# Patient Record
Sex: Female | Born: 1975 | Race: White | Hispanic: No | Smoking: Current every day smoker
Health system: Southern US, Community
[De-identification: ages and names within clinical notes are randomized; demographics above are authoritative.]

## PROBLEM LIST (undated history)

## (undated) DIAGNOSIS — F419 Anxiety disorder, unspecified: Secondary | ICD-10-CM

## (undated) DIAGNOSIS — Z21 Asymptomatic human immunodeficiency virus [HIV] infection status: Secondary | ICD-10-CM

## (undated) DIAGNOSIS — M549 Dorsalgia, unspecified: Secondary | ICD-10-CM

## (undated) DIAGNOSIS — I10 Essential (primary) hypertension: Secondary | ICD-10-CM

## (undated) DIAGNOSIS — K759 Inflammatory liver disease, unspecified: Secondary | ICD-10-CM

## (undated) DIAGNOSIS — T1491XA Suicide attempt, initial encounter: Secondary | ICD-10-CM

## (undated) DIAGNOSIS — R569 Unspecified convulsions: Secondary | ICD-10-CM

## (undated) DIAGNOSIS — K859 Acute pancreatitis without necrosis or infection, unspecified: Secondary | ICD-10-CM

## (undated) DIAGNOSIS — Z8739 Personal history of other diseases of the musculoskeletal system and connective tissue: Secondary | ICD-10-CM

## (undated) DIAGNOSIS — IMO0002 Reserved for concepts with insufficient information to code with codable children: Secondary | ICD-10-CM

## (undated) DIAGNOSIS — B20 Human immunodeficiency virus [HIV] disease: Secondary | ICD-10-CM

## (undated) HISTORY — PX: BACK SURGERY: SHX140

---

## 2002-04-09 ENCOUNTER — Emergency Department (HOSPITAL_COMMUNITY): Admission: EM | Admit: 2002-04-09 | Discharge: 2002-04-09 | Payer: Self-pay

## 2002-04-12 ENCOUNTER — Encounter: Payer: Self-pay | Admitting: *Deleted

## 2002-04-12 ENCOUNTER — Inpatient Hospital Stay (HOSPITAL_COMMUNITY): Admission: AD | Admit: 2002-04-12 | Discharge: 2002-04-12 | Payer: Self-pay | Admitting: *Deleted

## 2002-05-04 ENCOUNTER — Inpatient Hospital Stay (HOSPITAL_COMMUNITY): Admission: AD | Admit: 2002-05-04 | Discharge: 2002-05-04 | Payer: Self-pay | Admitting: Obstetrics and Gynecology

## 2002-05-04 ENCOUNTER — Inpatient Hospital Stay (HOSPITAL_COMMUNITY): Admission: AD | Admit: 2002-05-04 | Discharge: 2002-05-05 | Payer: Self-pay | Admitting: Obstetrics and Gynecology

## 2002-05-04 ENCOUNTER — Encounter: Payer: Self-pay | Admitting: Obstetrics and Gynecology

## 2011-09-22 ENCOUNTER — Encounter (HOSPITAL_COMMUNITY): Payer: Self-pay | Admitting: Emergency Medicine

## 2011-09-22 ENCOUNTER — Emergency Department (HOSPITAL_COMMUNITY)
Admission: EM | Admit: 2011-09-22 | Discharge: 2011-09-22 | Disposition: A | Discharge status: Home / Self Care | Payer: Self-pay | Attending: Emergency Medicine | Admitting: Emergency Medicine

## 2011-09-22 DIAGNOSIS — Z21 Asymptomatic human immunodeficiency virus [HIV] infection status: Secondary | ICD-10-CM | POA: Insufficient documentation

## 2011-09-22 DIAGNOSIS — G8929 Other chronic pain: Secondary | ICD-10-CM | POA: Insufficient documentation

## 2011-09-22 DIAGNOSIS — R569 Unspecified convulsions: Secondary | ICD-10-CM | POA: Insufficient documentation

## 2011-09-22 DIAGNOSIS — R1110 Vomiting, unspecified: Secondary | ICD-10-CM | POA: Insufficient documentation

## 2011-09-22 HISTORY — DX: Inflammatory liver disease, unspecified: K75.9

## 2011-09-22 HISTORY — DX: Unspecified convulsions: R56.9

## 2011-09-22 HISTORY — DX: Dorsalgia, unspecified: M54.9

## 2011-09-22 HISTORY — DX: Asymptomatic human immunodeficiency virus (hiv) infection status: Z21

## 2011-09-22 HISTORY — DX: Human immunodeficiency virus (HIV) disease: B20

## 2011-09-22 MED ORDER — MORPHINE SULFATE 4 MG/ML IJ SOLN
8.0000 mg | Freq: Once | INTRAMUSCULAR | Status: AC
  Administered 2011-09-22: 8 mg via INTRAVENOUS
  Filled 2011-09-22 (×2): qty 1

## 2011-09-22 MED ORDER — ONDANSETRON HCL 4 MG/2ML IJ SOLN
4.0000 mg | Freq: Once | INTRAMUSCULAR | Status: AC
  Administered 2011-09-22: 4 mg via INTRAVENOUS
  Filled 2011-09-22: qty 2

## 2011-09-22 MED ORDER — LORAZEPAM 1 MG PO TABS: 1.0000 mg | ORAL_TABLET | Freq: Once | ORAL | Status: DC

## 2011-09-22 MED ORDER — OXYCODONE-ACETAMINOPHEN 5-325 MG PO TABS: 1.0000 | ORAL_TABLET | Freq: Once | ORAL | Status: DC

## 2011-09-22 MED ORDER — LORAZEPAM 2 MG/ML IJ SOLN
2.0000 mg | Freq: Once | INTRAMUSCULAR | Status: AC
  Administered 2011-09-22: 2 mg via INTRAVENOUS
  Filled 2011-09-22: qty 1

## 2011-09-22 NOTE — ED Notes (Signed)
Not able to establish IV access.  IV team paged.
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
## 2011-09-22 NOTE — ED Notes (Signed)
Pt has a hx of seizures tonic clonic seizure friends seen her having a seizure approx 3 mins, was on sofa, alert to person place but does not remember having a seizure, not taking her xanax which is what she takes for her seizures. Standing and walked to stretcher, hx of this for the past 6 months smells of etoh .

## 2011-09-22 NOTE — Discharge Instructions (Signed)
Seizures You had a seizure. About 2% of the population will have a seizure problem during their lifetime. Sometimes the cause for the seizure is not known. Seizures are usually associated with one of these problems:  Epilepsy.   Not taking your seizure medicine.   Alcohol and drug abuse.   Head injury, strokes, tumors, and brain surgery.   High fever and infections.   Low blood sugar.  Evaluating a new seizure disorder may require having a brain scan or a brain wave test called an EEG. If you have been given a seizure medicine, it is very important that you take it as prescribed. Not taking these medicines as directed is the most common cause of seizures. Blood tests are often used to be sure you are taking the proper dose.  Seizures cause many different symptoms, from convulsions to brief blackouts. Do not ride a bike, drive a car, go swimming, climb in high or dangerous places such as ladders or roofs, or operate any dangerous equipment until you have your doctor's permission. If you hold a driver's license, state law may require that a report be made to the motor vehicles department. You should wear an emergency medical identification bracelet with information about your seizures. If you have any warning that a seizure may occur, lie down in a safe place to protect yourself. Teach your family and friends what to do if you have any further seizures. They should stay calm and try to keep you from falling on hard or sharp objects. It is best not to try to restrain a seizing person or to force anything into his or her mouth. Do not try to open clenched jaws. When the seizure is over, the person should be rolled on their side to help drain any vomit or secretions from the mouth. After a seizure, a person may be confused or drowsy for several minutes. An ambulance should be called if the seizure lasted more than 5 minutes or if confusion remains for more than 30 minutes. Call your caregiver or the  emergency department for further instructions. Do not drive until cleared by your caregiver or neurologist! Document Released: 06/12/2004 Document Revised: 01/15/2011 Document Reviewed: 05/05/2005 [employer] Patient Information 2012 [employer]. 

## 2012-03-06 ENCOUNTER — Emergency Department (HOSPITAL_BASED_OUTPATIENT_CLINIC_OR_DEPARTMENT_OTHER): Payer: Self-pay

## 2012-03-06 ENCOUNTER — Emergency Department (HOSPITAL_BASED_OUTPATIENT_CLINIC_OR_DEPARTMENT_OTHER)
Admission: EM | Admit: 2012-03-06 | Discharge: 2012-03-07 | Disposition: A | Discharge status: Home / Self Care | Payer: Self-pay | Attending: Emergency Medicine | Admitting: Emergency Medicine

## 2012-03-06 ENCOUNTER — Encounter (HOSPITAL_BASED_OUTPATIENT_CLINIC_OR_DEPARTMENT_OTHER): Payer: Self-pay | Admitting: *Deleted

## 2012-03-06 DIAGNOSIS — Z21 Asymptomatic human immunodeficiency virus [HIV] infection status: Secondary | ICD-10-CM | POA: Insufficient documentation

## 2012-03-06 DIAGNOSIS — S0181XA Laceration without foreign body of other part of head, initial encounter: Secondary | ICD-10-CM

## 2012-03-06 DIAGNOSIS — S0003XA Contusion of scalp, initial encounter: Secondary | ICD-10-CM | POA: Insufficient documentation

## 2012-03-06 DIAGNOSIS — R4789 Other speech disturbances: Secondary | ICD-10-CM | POA: Insufficient documentation

## 2012-03-06 DIAGNOSIS — S0180XA Unspecified open wound of other part of head, initial encounter: Secondary | ICD-10-CM | POA: Insufficient documentation

## 2012-03-06 DIAGNOSIS — S1093XA Contusion of unspecified part of neck, initial encounter: Secondary | ICD-10-CM | POA: Insufficient documentation

## 2012-03-06 HISTORY — DX: Acute pancreatitis without necrosis or infection, unspecified: K85.90

## 2012-03-06 HISTORY — DX: Reserved for concepts with insufficient information to code with codable children: IMO0002

## 2012-03-06 MED ORDER — LIDOCAINE-EPINEPHRINE 2 %-1:100000 IJ SOLN
20.0000 mL | Freq: Once | INTRAMUSCULAR | Status: DC
  Filled 2012-03-06: qty 1
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
## 2012-03-06 NOTE — ED Notes (Signed)
Sprite given to pt. Per MD request.

## 2012-03-06 NOTE — ED Notes (Signed)
MD at bedside to suture.
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
## 2012-03-07 MED ORDER — ALPRAZOLAM 0.5 MG PO TABS
2.0000 mg | ORAL_TABLET | Freq: Once | ORAL | Status: AC
  Administered 2012-03-07: 2 mg via ORAL
  Filled 2012-03-07: qty 4
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
## 2012-09-21 ENCOUNTER — Encounter (HOSPITAL_BASED_OUTPATIENT_CLINIC_OR_DEPARTMENT_OTHER): Payer: Self-pay | Admitting: *Deleted

## 2012-09-21 ENCOUNTER — Emergency Department (HOSPITAL_BASED_OUTPATIENT_CLINIC_OR_DEPARTMENT_OTHER)
Admission: EM | Admit: 2012-09-21 | Discharge: 2012-09-21 | Disposition: A | Discharge status: Home / Self Care | Payer: Self-pay | Attending: Emergency Medicine | Admitting: Emergency Medicine

## 2012-09-21 DIAGNOSIS — L02414 Cutaneous abscess of left upper limb: Secondary | ICD-10-CM

## 2012-09-21 DIAGNOSIS — R52 Pain, unspecified: Secondary | ICD-10-CM | POA: Insufficient documentation

## 2012-09-21 DIAGNOSIS — Z8669 Personal history of other diseases of the nervous system and sense organs: Secondary | ICD-10-CM | POA: Insufficient documentation

## 2012-09-21 DIAGNOSIS — Z21 Asymptomatic human immunodeficiency virus [HIV] infection status: Secondary | ICD-10-CM | POA: Insufficient documentation

## 2012-09-21 DIAGNOSIS — R509 Fever, unspecified: Secondary | ICD-10-CM | POA: Insufficient documentation

## 2012-09-21 DIAGNOSIS — F17200 Nicotine dependence, unspecified, uncomplicated: Secondary | ICD-10-CM | POA: Insufficient documentation

## 2012-09-21 DIAGNOSIS — Z8739 Personal history of other diseases of the musculoskeletal system and connective tissue: Secondary | ICD-10-CM | POA: Insufficient documentation

## 2012-09-21 DIAGNOSIS — L0390 Cellulitis, unspecified: Secondary | ICD-10-CM

## 2012-09-21 DIAGNOSIS — IMO0002 Reserved for concepts with insufficient information to code with codable children: Secondary | ICD-10-CM | POA: Insufficient documentation

## 2012-09-21 DIAGNOSIS — R110 Nausea: Secondary | ICD-10-CM | POA: Insufficient documentation

## 2012-09-21 DIAGNOSIS — Z8719 Personal history of other diseases of the digestive system: Secondary | ICD-10-CM | POA: Insufficient documentation

## 2012-09-21 DIAGNOSIS — Z79899 Other long term (current) drug therapy: Secondary | ICD-10-CM | POA: Insufficient documentation

## 2012-09-21 MED ORDER — FENTANYL CITRATE 0.05 MG/ML IJ SOLN
100.0000 ug | Freq: Once | INTRAMUSCULAR | Status: AC
  Administered 2012-09-21: 100 ug via NASAL
  Filled 2012-09-21: qty 2

## 2012-09-21 MED ORDER — CLINDAMYCIN HCL 300 MG PO CAPS: 300.0000 mg | ORAL_CAPSULE | Freq: Four times a day (QID) | ORAL | Status: DC

## 2012-09-21 MED ORDER — FENTANYL CITRATE 0.05 MG/ML IJ SOLN
INTRAMUSCULAR | Status: AC
  Administered 2012-09-21: 50 ug
  Filled 2012-09-21: qty 2

## 2012-09-21 MED ORDER — LIDOCAINE-EPINEPHRINE 2 %-1:100000 IJ SOLN
INTRAMUSCULAR | Status: AC
  Filled 2012-09-21: qty 1

## 2012-09-21 NOTE — ED Notes (Signed)
Has been injecting Oxycodone in her left deltoid now it is infected.

## 2012-09-21 NOTE — ED Provider Notes (Signed)
History     CSN: [id number]  Arrival date & time 09/21/12  1339   First MD Initiated Contact with Patient 09/21/12 1526      Chief Complaint  Patient presents with  . Abscess    (Consider location/radiation/quality/duration/timing/severity/associated sxs/prior treatment) HPI This 37 year old female has several days gradual onset left arm lateral deltoid region abscess gradually worsening with surrounding erythema induration cellulitis with fever up to 103 last night but no fever today she did have some body aches and nausea last night but no generalized body aches today, she is no lightheadedness no confusion no shortness of breath no generalized rash she is no change in her chronic severe low back pain and chronic severe abdominal pain there is no treatment prior to arrival she does have history of IV drug abuse but does not inject in the area where she has her abscess. Past Medical History  Diagnosis Date  . Seizures   . Back ache   . HIV (human immunodeficiency virus infection)   . Hepatitis   . DDD (degenerative disc disease)   . Pancreatitis     Past Surgical History  Procedure Laterality Date  . Back surgery      No family history on file.  History  Substance Use Topics  . Smoking status: Current Every Day Smoker  . Smokeless tobacco: Not on file  . Alcohol Use: Yes     Comment: occasional    OB History   Grav Para Term Preterm Abortions TAB SAB Ect Mult Living                  Review of Systems 10 Systems reviewed and are negative for acute change except as noted in the HPI. Allergies  Sulfa antibiotics  Home Medications   Current Outpatient Rx  Name  Route  Sig  Dispense  Refill  . alprazolam (XANAX) 2 MG tablet   Oral   Take 2 mg by mouth 3 (three) times daily as needed. For anxiety         . Buprenorphine HCl-Naloxone HCl (SUBOXONE) 4-1 MG FILM   Sublingual   Place 2 tablets under the tongue every 4 (four) hours.         . clindamycin  (CLEOCIN) 300 MG capsule   Oral   Take 1 capsule (300 mg total) by mouth 4 (four) times daily. X 7 days   28 capsule   0   . HYDROcodone-acetaminophen (NORCO/VICODIN) 5-325 MG per tablet   Oral   Take 2 tablets by mouth every 4 (four) hours as needed for pain.   6 tablet   0   . oxyCODONE (OXYCONTIN) 20 MG 12 hr tablet   Oral   Take 20 mg by mouth every 12 (twelve) hours.         Marland Kitchen oxycodone (ROXICODONE) 30 MG immediate release tablet   Oral   Take 30 mg by mouth every 4 (four) hours as needed for pain.           BP 138/97  Pulse 106  Temp(Src) 98.9 F (37.2 C) (Oral)  Resp 20  Wt 110 lb (49.896 kg)  SpO2 97%  Physical Exam  Nursing note and vitals reviewed. Constitutional:  Awake, alert, nontoxic appearance.  HENT:  Head: Atraumatic.  Eyes: Right eye exhibits no discharge. Left eye exhibits no discharge.  Neck: Neck supple.  Cardiovascular: Regular rhythm.   No murmur heard. Mildly tachycardic  Pulmonary/Chest: Effort normal and breath sounds normal. No respiratory  distress. She has no wheezes. She has no rales. She exhibits no tenderness.  Abdominal: Soft. Bowel sounds are normal. She exhibits no distension and no mass. There is tenderness. There is no rebound and no guarding.  Minimal diffuse abdominal tenderness which patient states is baseline for her  Musculoskeletal: She exhibits tenderness.  Baseline ROM, no obvious new focal weakness. Mild diffuse lumbar back tenderness which patient states is baseline for her right arm is nontender both legs are nontender left arm has radial pulse intact capillary refill less than 2 seconds normal light touch good range of motion of her arm including her hand with no obvious focal weakness noted her left deltoid region as erythema induration warmth tenderness consistent with localized cellulitis with a central abscess approximately 4 cm in diameter fluctuance confirmed in the subcutaneous space as a fluid collection with  bedside limited ultrasound  Neurological:  Mental status and motor strength appears baseline for patient and situation.  Skin: No rash noted.  Psychiatric: She has a normal mood and affect.    ED Course  Procedures (including critical care time) I&D Abscess: Timeout taken, sterile prep , 2% lidocaine with epinephrine local anesthetic 8 MLS, #11 scalpel 3 cm incision with copious purulent drainage and loculations broken and is complicated subcutaneous abscess, Pt tolerated procedure well with no apparent immediate complications.  Patient appears nontoxic in the emergency department I doubt sepsis or necrotizing fasciitis, treatment options are discussed and I believe a reasonable decision made to attempt incision and drainage in the emergency department which was performed then place the patient on oral antibiotics for close outpatient followup tomorrow in the emergency department since the patient has no primary care physician. Labs Reviewed - No data to display No results found.   1. Abscess of arm, left   2. Cellulitis       MDM  Patient / Family / Caregiver informed of clinical course, understand medical decision-making process, and agree with plan.  I doubt any other EMC precluding discharge at this time including, but not necessarily limited to the following:sepsis.        Hurman Horn, MD 09/22/12 [phone]

## 2012-09-22 ENCOUNTER — Emergency Department (HOSPITAL_BASED_OUTPATIENT_CLINIC_OR_DEPARTMENT_OTHER)
Admission: EM | Admit: 2012-09-22 | Discharge: 2012-09-22 | Disposition: A | Discharge status: Home / Self Care | Payer: Self-pay | Attending: Emergency Medicine | Admitting: Emergency Medicine

## 2012-09-22 ENCOUNTER — Encounter (HOSPITAL_BASED_OUTPATIENT_CLINIC_OR_DEPARTMENT_OTHER): Payer: Self-pay

## 2012-09-22 DIAGNOSIS — IMO0002 Reserved for concepts with insufficient information to code with codable children: Secondary | ICD-10-CM | POA: Insufficient documentation

## 2012-09-22 DIAGNOSIS — Z8669 Personal history of other diseases of the nervous system and sense organs: Secondary | ICD-10-CM | POA: Insufficient documentation

## 2012-09-22 DIAGNOSIS — F17200 Nicotine dependence, unspecified, uncomplicated: Secondary | ICD-10-CM | POA: Insufficient documentation

## 2012-09-22 DIAGNOSIS — Z8719 Personal history of other diseases of the digestive system: Secondary | ICD-10-CM | POA: Insufficient documentation

## 2012-09-22 DIAGNOSIS — Z21 Asymptomatic human immunodeficiency virus [HIV] infection status: Secondary | ICD-10-CM | POA: Insufficient documentation

## 2012-09-22 DIAGNOSIS — L0390 Cellulitis, unspecified: Secondary | ICD-10-CM

## 2012-09-22 DIAGNOSIS — Z79899 Other long term (current) drug therapy: Secondary | ICD-10-CM | POA: Insufficient documentation

## 2012-09-22 DIAGNOSIS — Z8739 Personal history of other diseases of the musculoskeletal system and connective tissue: Secondary | ICD-10-CM | POA: Insufficient documentation

## 2012-09-22 MED ORDER — HYDROCODONE-ACETAMINOPHEN 5-325 MG PO TABS: 2.0000 | ORAL_TABLET | ORAL | Status: DC | PRN

## 2012-09-22 NOTE — ED Provider Notes (Signed)
History     CSN: [id number]  Arrival date & time 09/22/12  1135   First MD Initiated Contact with Patient 09/22/12 1146      Chief Complaint  Patient presents with  . Wound Check    (Consider location/radiation/quality/duration/timing/severity/associated sxs/prior treatment) HPI Comments: Patient presents to the ER for recheck of a left upper arm abscess with overlying cellulitis. Patient had incision and drainage yesterday. Patient does report that her redness and swelling is improved. She is concerned that there might still be some loss in the area of the shoulder region, however, because the area is still hard to the touch. No fevers. She is taking the oral antibiotics.  Patient is a 37 y.o. female presenting with wound check.  Wound Check    Past Medical History  Diagnosis Date  . Seizures   . Back ache   . HIV (human immunodeficiency virus infection)   . Hepatitis   . DDD (degenerative disc disease)   . Pancreatitis     Past Surgical History  Procedure Laterality Date  . Back surgery      No family history on file.  History  Substance Use Topics  . Smoking status: Current Every Day Smoker  . Smokeless tobacco: Not on file  . Alcohol Use: Yes     Comment: occasional    OB History   Grav Para Term Preterm Abortions TAB SAB Ect Mult Living                  Review of Systems  Constitutional: Negative for fever.  Skin: Positive for wound.    Allergies  Sulfa antibiotics  Home Medications   Current Outpatient Rx  Name  Route  Sig  Dispense  Refill  . oxycodone (ROXICODONE) 30 MG immediate release tablet   Oral   Take 30 mg by mouth every 4 (four) hours as needed for pain.         Marland Kitchen alprazolam (XANAX) 2 MG tablet   Oral   Take 2 mg by mouth 3 (three) times daily as needed. For anxiety         . Buprenorphine HCl-Naloxone HCl (SUBOXONE) 4-1 MG FILM   Sublingual   Place 2 tablets under the tongue every 4 (four) hours.         . clindamycin  (CLEOCIN) 300 MG capsule   Oral   Take 1 capsule (300 mg total) by mouth 4 (four) times daily. X 7 days   28 capsule   0   . oxyCODONE (OXYCONTIN) 20 MG 12 hr tablet   Oral   Take 20 mg by mouth every 12 (twelve) hours.           BP 105/66  Pulse 106  Temp(Src) 98.7 F (37.1 C) (Oral)  Resp 16  SpO2 100%  Physical Exam  Constitutional: She is oriented to person, place, and time. She appears well-developed and well-nourished.  HENT:  Head: Normocephalic.  Eyes: Pupils are equal, round, and reactive to light.  Neck: Normal range of motion.  Cardiovascular: Normal rate, regular rhythm and normal heart sounds.   Pulmonary/Chest: Effort normal and breath sounds normal.  Musculoskeletal: Normal range of motion.  Neurological: She is alert and oriented to person, place, and time. A cranial nerve deficit is present. Coordination normal.  Skin:       ED Course  Procedures (including critical care time)  Labs Reviewed - No data to display No results found.   Diagnosis: 1. Abscess 2.  Cellulitis    MDM  This presents to the ER for check after incision and drainage. Patient does feel that the erythema of the upper arm is much improved. She appears to be responding to the antibiotics. She did have drainage of pus yesterday. She was concerned that maybe some more pus in a different area. The area was therefore evaluated with bedside ultrasound and no fluid pockets are seen.  Has had significant improvement since yesterday with current treatment regimen. Continue current plan and will return if symptoms worsen.       Gilda Crease, MD 09/22/12 1218

## 2012-09-22 NOTE — ED Notes (Signed)
Wound check to left upper arm-had area to left upper arm where PICC line was removed approx 1 month ago

## 2013-02-21 ENCOUNTER — Ambulatory Visit (INDEPENDENT_AMBULATORY_CARE_PROVIDER_SITE_OTHER): Payer: Self-pay | Admitting: Physician Assistant

## 2013-02-21 ENCOUNTER — Encounter: Payer: Self-pay | Admitting: Physician Assistant

## 2013-02-21 ENCOUNTER — Ambulatory Visit: Payer: Self-pay | Admitting: Physician Assistant

## 2013-02-21 VITALS — BP 100/70 | HR 126 | Ht 66.0 in | Wt 124.0 lb

## 2013-02-21 DIAGNOSIS — M5136 Other intervertebral disc degeneration, lumbar region: Secondary | ICD-10-CM

## 2013-02-21 DIAGNOSIS — M5137 Other intervertebral disc degeneration, lumbosacral region: Secondary | ICD-10-CM

## 2013-02-21 NOTE — Patient Instructions (Signed)
[facility].

## 2013-02-21 NOTE — Progress Notes (Addendum)
  Subjective:    Patient ID: Alicia Hayes, female    DOB: [birth year], 37 y.o.   MRN: [id number]  HPI Patient is a 37 yo female who presents to the clinic to establish care. She is wanting pain medication and xanax refills today. Per patient patient is taking Xanax 2 mg 5 times a day as well as oxycodone 30 mg up to 6 times a day. This has been taking for severe anxiety and panic attacks as well as chronic pain and degenerative disc disease. Per patient she has had lumbar back surgery. Patient does not present with any records. She reports that her PCP has retired and she needs someone to pick up prescribing her pain medications and anxiety medications. She does bring in some bottles but they are dated in November of 2013 she does have a history of a withdrawal seizure from xanax.   She is not aware of when her last Pap smear was. She does currently smoke one pack a day. Patient is also HIV positive.   Review of Systems     Objective:   Physical Exam  Constitutional:  She appeared "out of it". She struggled to make eye contact and shake my hand. I found her trying to open up the drawer in exam room but did not have enough coordination to do so.   Psychiatric:  She slurred her words. She was very adamant about pain medication and xanax.          Assessment & Plan:  DDD/chronic pain syndrome- GAD-7 was 21. PHQ-9 was 19. Patient is adamant about having her Xanax and oxycodone refilled today. I discussed with Dr. Glade Lloyd and there were too many red flags to refill today. We discussed with patient that she was on too high the dose and we needed to taper her down. She reports that Klonopin does not work.Discuss with patient that I would like to see her records from Dr. Troy Sine before I started prescribing any medications. I also discussed with her that she needs to be going to a pain clinic. I did put in a referral for pain clinic today. When patient comes I was not going to give her her pain  medication she began talking about how she has to have it or she will go into nausea, vomiting, diarrhea. I discussed with her that those are withdrawal symptoms and that she needs to be detoxed. Patient was not receptive to being told this. I offered resources such as [facility].or [facility]. She did not want to discuss this option. She stated she needed her medications. When I looked into the national database registry for controlled substances oxycodone was listed in two-week increments from a Dr. Troy Sine. Xanax was not found in the system at all. Patient left without finishing the rest of the visit once denied pain medication. We did not have the opportunity to do physical exam or talk about other medications for anxiety.

## 2013-04-15 ENCOUNTER — Emergency Department (HOSPITAL_COMMUNITY)
Admission: EM | Admit: 2013-04-15 | Discharge: 2013-04-16 | Disposition: A | Discharge status: Home / Self Care | Payer: Self-pay | Attending: Emergency Medicine | Admitting: Emergency Medicine

## 2013-04-15 ENCOUNTER — Encounter (HOSPITAL_COMMUNITY): Payer: Self-pay | Admitting: Emergency Medicine

## 2013-04-15 DIAGNOSIS — Z8719 Personal history of other diseases of the digestive system: Secondary | ICD-10-CM | POA: Insufficient documentation

## 2013-04-15 DIAGNOSIS — Z3202 Encounter for pregnancy test, result negative: Secondary | ICD-10-CM | POA: Insufficient documentation

## 2013-04-15 DIAGNOSIS — F1310 Sedative, hypnotic or anxiolytic abuse, uncomplicated: Secondary | ICD-10-CM | POA: Insufficient documentation

## 2013-04-15 DIAGNOSIS — R000 Tachycardia, unspecified: Secondary | ICD-10-CM | POA: Insufficient documentation

## 2013-04-15 DIAGNOSIS — Z21 Asymptomatic human immunodeficiency virus [HIV] infection status: Secondary | ICD-10-CM | POA: Insufficient documentation

## 2013-04-15 DIAGNOSIS — Z8739 Personal history of other diseases of the musculoskeletal system and connective tissue: Secondary | ICD-10-CM | POA: Insufficient documentation

## 2013-04-15 DIAGNOSIS — Y939 Activity, unspecified: Secondary | ICD-10-CM | POA: Insufficient documentation

## 2013-04-15 DIAGNOSIS — F1110 Opioid abuse, uncomplicated: Secondary | ICD-10-CM | POA: Insufficient documentation

## 2013-04-15 DIAGNOSIS — F17200 Nicotine dependence, unspecified, uncomplicated: Secondary | ICD-10-CM | POA: Insufficient documentation

## 2013-04-15 DIAGNOSIS — Y929 Unspecified place or not applicable: Secondary | ICD-10-CM | POA: Insufficient documentation

## 2013-04-15 DIAGNOSIS — X58XXXA Exposure to other specified factors, initial encounter: Secondary | ICD-10-CM | POA: Insufficient documentation

## 2013-04-15 DIAGNOSIS — Z8669 Personal history of other diseases of the nervous system and sense organs: Secondary | ICD-10-CM | POA: Insufficient documentation

## 2013-04-15 DIAGNOSIS — F1910 Other psychoactive substance abuse, uncomplicated: Secondary | ICD-10-CM

## 2013-04-15 DIAGNOSIS — S0003XA Contusion of scalp, initial encounter: Secondary | ICD-10-CM | POA: Insufficient documentation

## 2013-04-15 DIAGNOSIS — F10929 Alcohol use, unspecified with intoxication, unspecified: Secondary | ICD-10-CM

## 2013-04-15 DIAGNOSIS — F1010 Alcohol abuse, uncomplicated: Secondary | ICD-10-CM | POA: Insufficient documentation

## 2013-04-15 LAB — URINALYSIS, ROUTINE W REFLEX MICROSCOPIC
Hgb urine dipstick: NEGATIVE
Leukocytes, UA: NEGATIVE
Nitrite: NEGATIVE
Protein, ur: NEGATIVE mg/dL
Specific Gravity, Urine: 1.015 (ref 1.005–1.030)
Urobilinogen, UA: 0.2 mg/dL (ref 0.0–1.0)

## 2013-04-15 LAB — CBC WITH DIFFERENTIAL/PLATELET
Basophils Absolute: 0.1 10*3/uL (ref 0.0–0.1)
Basophils Relative: 2 % — ABNORMAL HIGH (ref 0–1)
Eosinophils Absolute: 0.3 10*3/uL (ref 0.0–0.7)
MCH: 33.9 pg (ref 26.0–34.0)
MCHC: 34.8 g/dL (ref 30.0–36.0)
Monocytes Absolute: 0.3 10*3/uL (ref 0.1–1.0)
Neutro Abs: 2.9 10*3/uL (ref 1.7–7.7)
Neutrophils Relative %: 48 % (ref 43–77)
RDW: 15.1 % (ref 11.5–15.5)

## 2013-04-15 LAB — PREGNANCY, URINE: Preg Test, Ur: NEGATIVE

## 2013-04-15 LAB — ACETAMINOPHEN LEVEL: Acetaminophen (Tylenol), Serum: <15 ug/mL (ref 10–30)

## 2013-04-15 LAB — COMPREHENSIVE METABOLIC PANEL
AST: 46 U/L — ABNORMAL HIGH (ref 0–37)
Albumin: 4.6 g/dL (ref 3.5–5.2)
Chloride: 103 mEq/L (ref 96–112)
Creatinine, Ser: 0.5 mg/dL (ref 0.50–1.10)
Total Bilirubin: 0.2 mg/dL — ABNORMAL LOW (ref 0.3–1.2)
Total Protein: 8.7 g/dL — ABNORMAL HIGH (ref 6.0–8.3)

## 2013-04-15 LAB — POCT I-STAT, CHEM 8
BUN: 6 mg/dL (ref 6–23)
Chloride: 106 mEq/L (ref 96–112)
Creatinine, Ser: 1.3 mg/dL — ABNORMAL HIGH (ref 0.50–1.10)
Glucose, Bld: 96 mg/dL (ref 70–99)
Hemoglobin: 15.3 g/dL — ABNORMAL HIGH (ref 12.0–15.0)
Potassium: 3.2 mEq/L — ABNORMAL LOW (ref 3.5–5.1)
Sodium: 146 mEq/L — ABNORMAL HIGH (ref 135–145)

## 2013-04-15 LAB — SALICYLATE LEVEL: Salicylate Lvl: <2 mg/dL — ABNORMAL LOW (ref 2.8–20.0)

## 2013-04-15 LAB — ETHANOL: Alcohol, Ethyl (B): 541 mg/dL (ref 0–11)

## 2013-04-15 LAB — RAPID URINE DRUG SCREEN, HOSP PERFORMED: Opiates: NOT DETECTED

## 2013-04-15 MED ORDER — NALOXONE HCL 0.4 MG/ML IJ SOLN
0.2000 mg | Freq: Once | INTRAMUSCULAR | Status: AC
  Administered 2013-04-15: 0.2 mg via INTRAVENOUS
  Filled 2013-04-15: qty 1

## 2013-04-15 MED ORDER — SODIUM CHLORIDE 0.9 % IV BOLUS (SEPSIS)
1000.0000 mL | Freq: Once | INTRAVENOUS | Status: AC
  Administered 2013-04-15: 1000 mL via INTRAVENOUS
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
## 2013-04-15 NOTE — ED Notes (Signed)
Per EMS: Pt reports drinking alcohol, denies heroin use. 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 Empty zanax and oxycodone bottles at scene. Pt oriented to place and situation.

## 2013-04-15 NOTE — ED Provider Notes (Signed)
CSN: [id number]     Arrival date & time 04/15/13  1817 History   First MD Initiated Contact with Patient 04/15/13 1819     Chief Complaint  Patient presents with  . Altered Mental Status   level 5 caveat due to altered mental status. (Consider location/radiation/quality/duration/timing/severity/associated sxs/prior Treatment) Patient is a 37 y.o. female presenting with altered mental status. The history is provided by the patient.  Altered Mental Status  patient was brought in by EMS for altered mental status. Reportedly had been drinking and doing Ativan. Became less responsive. Reports that CPR was done by friends. She was initially conversive with EMS but also been combative. Has a history of heroin abuse. Reportedly told them she had not taken heroin. She'll not answer my questions.  Past Medical History  Diagnosis Date  . Seizures   . Back ache   . HIV (human immunodeficiency virus infection)   . Hepatitis   . DDD (degenerative disc disease)   . Pancreatitis    Past Surgical History  Procedure Laterality Date  . Back surgery     Family History  Problem Relation Age of Onset  . Hypertension Father   . Hypertension Paternal Grandmother   . Hypertension Paternal Grandfather    History  Substance Use Topics  . Smoking status: Current Every Day Smoker  . Smokeless tobacco: Not on file  . Alcohol Use: Yes     Comment: occasional   OB History   Grav Para Term Preterm Abortions TAB SAB Ect Mult Living                 Review of Systems  Unable to perform ROS: Mental status change    Allergies  Sulfa antibiotics  Home Medications   Current Outpatient Rx  Name  Route  Sig  Dispense  Refill  . alprazolam (XANAX) 2 MG tablet   Oral   Take 2 mg by mouth 3 (three) times daily as needed. For anxiety         . oxycodone (ROXICODONE) 30 MG immediate release tablet   Oral   Take 30 mg by mouth every 4 (four) hours as needed for pain.          BP 119/81  Pulse  102  Temp(Src) 98 F (36.7 C) (Core (Comment))  Resp 18  SpO2 93% Physical Exam  Constitutional: She appears well-developed and well-nourished.  HENT:  Small area of ecchymosis below left eye. No crepitance or deformity.  Eyes:  Pupils dilated and sluggish  Neck: Neck supple.  Cardiovascular: Regular rhythm.   Mild tachycardia  Pulmonary/Chest: Effort normal and breath sounds normal.  Abdominal: Soft. There is no tenderness.  Musculoskeletal: She exhibits no tenderness.  Neurological:  Patient will look to my voice. She'll follow some commands but will not answer my questions. Had been verbal with EMS, and had also been combative.  Skin: Skin is warm and dry.    ED Course  Procedures (including critical care time) Labs Review Labs Reviewed  CBC WITH DIFFERENTIAL - Abnormal; Notable for the following:    Basophils Relative 2 (*)    All other components within normal limits  COMPREHENSIVE METABOLIC PANEL - Abnormal; Notable for the following:    Potassium 3.4 (*)    Total Protein 8.7 (*)    AST 46 (*)    Total Bilirubin 0.2 (*)    All other components within normal limits  ETHANOL - Abnormal; Notable for the following:    Alcohol, Ethyl (  B) 541 (*)    All other components within normal limits  SALICYLATE LEVEL - Abnormal; Notable for the following:    Salicylate Lvl <2.0 (*)    All other components within normal limits  URINE RAPID DRUG SCREEN (HOSP PERFORMED) - Abnormal; Notable for the following:    Benzodiazepines POSITIVE (*)    All other components within normal limits  POCT I-STAT, CHEM 8 - Abnormal; Notable for the following:    Sodium 146 (*)    Potassium 3.2 (*)    Creatinine, Ser 1.30 (*)    Calcium, Ion 1.09 (*)    Hemoglobin 15.3 (*)    All other components within normal limits  ACETAMINOPHEN LEVEL  URINALYSIS, ROUTINE W REFLEX MICROSCOPIC  PREGNANCY, URINE   Imaging Review No results found.  EKG Interpretation    Date/Time:  Friday April 15 2013 18:34:01 EST Ventricular Rate:  111 PR Interval:  151 QRS Duration: 71 QT Interval:  339 QTC Calculation: 461 R Axis:   73 Text Interpretation:  Sinus tachycardia Borderline low voltage, extremity leads Probable anteroseptal infarct, old Confirmed by Angellee Cohill  MD, Shaylie Eklund ([id number]) on 04/15/2013 11:13:16 PM            MDM   1. Substance abuse   2. Alcohol intoxication    Patient with altered mental status. Likely due to substance abuse and alcohol intoxication. There was some concern of an overdose of opiates and Ativan. Mental status improved somewhat with Narcan. Her alcohol level is severely elevated. Drug database was reviewed and she would not have many Ativan after she had been taking it as prescribed. We'll continue to monitor patient and her mental status for discharge or further treatment. Will need mental status checks since there is some evidence of trauma on her face also.    Juliet Rude. Rubin Payor, MD 04/15/13 2315

## 2013-04-15 NOTE — ED Provider Notes (Signed)
11:47 PM Patient is awake and alert at this time.  She denies weakness of her arms or leg.  She denies headache or head injury.  She denies difficulty breathing or swallowing.  No chest pain shortness of breath.  She denies abdominal pain.  Patient is stable to go home at this time as long she has a safe ride.  She is currently called her boyfriend to come and pick her up.  Patient will be discharged home.  Lyanne Co, MD 04/15/13 [phone]

## 2013-04-30 ENCOUNTER — Emergency Department (HOSPITAL_COMMUNITY): Payer: Self-pay

## 2013-04-30 ENCOUNTER — Encounter (HOSPITAL_COMMUNITY): Payer: Self-pay | Admitting: Emergency Medicine

## 2013-04-30 ENCOUNTER — Inpatient Hospital Stay (HOSPITAL_COMMUNITY)
Admission: EM | Admit: 2013-04-30 | Discharge: 2013-05-03 | DRG: 917 | Disposition: A | Discharge status: Other Acute Inpatient Hospital | Payer: Federal, State, Local not specified - Other | Attending: Internal Medicine | Admitting: Internal Medicine

## 2013-04-30 DIAGNOSIS — T50901A Poisoning by unspecified drugs, medicaments and biological substances, accidental (unintentional), initial encounter: Secondary | ICD-10-CM

## 2013-04-30 DIAGNOSIS — F1110 Opioid abuse, uncomplicated: Secondary | ICD-10-CM

## 2013-04-30 DIAGNOSIS — Z881 Allergy status to other antibiotic agents status: Secondary | ICD-10-CM

## 2013-04-30 DIAGNOSIS — Z598 Other problems related to housing and economic circumstances: Secondary | ICD-10-CM

## 2013-04-30 DIAGNOSIS — F10929 Alcohol use, unspecified with intoxication, unspecified: Secondary | ICD-10-CM

## 2013-04-30 DIAGNOSIS — F1120 Opioid dependence, uncomplicated: Secondary | ICD-10-CM | POA: Diagnosis present

## 2013-04-30 DIAGNOSIS — B20 Human immunodeficiency virus [HIV] disease: Secondary | ICD-10-CM | POA: Diagnosis present

## 2013-04-30 DIAGNOSIS — R509 Fever, unspecified: Secondary | ICD-10-CM | POA: Diagnosis present

## 2013-04-30 DIAGNOSIS — R569 Unspecified convulsions: Secondary | ICD-10-CM | POA: Diagnosis present

## 2013-04-30 DIAGNOSIS — F39 Unspecified mood [affective] disorder: Secondary | ICD-10-CM | POA: Diagnosis present

## 2013-04-30 DIAGNOSIS — R4182 Altered mental status, unspecified: Secondary | ICD-10-CM

## 2013-04-30 DIAGNOSIS — M1990 Unspecified osteoarthritis, unspecified site: Secondary | ICD-10-CM | POA: Diagnosis present

## 2013-04-30 DIAGNOSIS — Z79899 Other long term (current) drug therapy: Secondary | ICD-10-CM

## 2013-04-30 DIAGNOSIS — Z23 Encounter for immunization: Secondary | ICD-10-CM

## 2013-04-30 DIAGNOSIS — F17200 Nicotine dependence, unspecified, uncomplicated: Secondary | ICD-10-CM | POA: Diagnosis present

## 2013-04-30 DIAGNOSIS — T424X4A Poisoning by benzodiazepines, undetermined, initial encounter: Principal | ICD-10-CM | POA: Diagnosis present

## 2013-04-30 DIAGNOSIS — F10229 Alcohol dependence with intoxication, unspecified: Secondary | ICD-10-CM | POA: Diagnosis present

## 2013-04-30 DIAGNOSIS — T43591A Poisoning by other antipsychotics and neuroleptics, accidental (unintentional), initial encounter: Secondary | ICD-10-CM | POA: Diagnosis present

## 2013-04-30 DIAGNOSIS — I959 Hypotension, unspecified: Secondary | ICD-10-CM | POA: Diagnosis present

## 2013-04-30 DIAGNOSIS — H5316 Psychophysical visual disturbances: Secondary | ICD-10-CM | POA: Diagnosis present

## 2013-04-30 DIAGNOSIS — Z5987 Material hardship due to limited financial resources, not elsewhere classified: Secondary | ICD-10-CM

## 2013-04-30 DIAGNOSIS — F22 Delusional disorders: Secondary | ICD-10-CM | POA: Diagnosis present

## 2013-04-30 DIAGNOSIS — F411 Generalized anxiety disorder: Secondary | ICD-10-CM | POA: Diagnosis present

## 2013-04-30 LAB — CBC WITH DIFFERENTIAL/PLATELET
Basophils Absolute: 0.1 10*3/uL (ref 0.0–0.1)
Basophils Relative: 1 % (ref 0–1)
Eosinophils Absolute: 0.5 10*3/uL (ref 0.0–0.7)
Eosinophils Relative: 7 % — ABNORMAL HIGH (ref 0–5)
Hemoglobin: 13.4 g/dL (ref 12.0–15.0)
MCH: 34.8 pg — ABNORMAL HIGH (ref 26.0–34.0)
MCHC: 35.6 g/dL (ref 30.0–36.0)
MCV: 97.7 fL (ref 78.0–100.0)
Monocytes Absolute: 0.3 10*3/uL (ref 0.1–1.0)
Monocytes Relative: 5 % (ref 3–12)
Neutrophils Relative %: 44 % (ref 43–77)
WBC: 6.2 10*3/uL (ref 4.0–10.5)

## 2013-04-30 LAB — CG4 I-STAT (LACTIC ACID): Lactic Acid, Venous: 3.43 mmol/L — ABNORMAL HIGH (ref 0.5–2.2)

## 2013-04-30 MED ORDER — NALOXONE HCL 1 MG/ML IJ SOLN
INTRAMUSCULAR | Status: AC
  Administered 2013-04-30: 2 mg via INTRAVENOUS
  Filled 2013-04-30: qty 2

## 2013-04-30 MED ORDER — DEXTROSE 5 % IV SOLN
0.5000 mg/h | INTRAVENOUS | Status: DC
  Administered 2013-05-01 (×2): 0.5 mg/h via INTRAVENOUS
  Filled 2013-04-30 (×2): qty 4

## 2013-04-30 MED ORDER — NALOXONE HCL 0.4 MG/ML IJ SOLN: 0.4000 mg | Freq: Once | INTRAMUSCULAR | Status: DC

## 2013-04-30 MED ORDER — SODIUM CHLORIDE 0.9 % IV SOLN
INTRAVENOUS | Status: DC
  Administered 2013-05-01 (×2): via INTRAVENOUS

## 2013-04-30 NOTE — ED Provider Notes (Addendum)
CSN: [id number]     Arrival date & time 04/30/13  2302 History   First MD Initiated Contact with Patient 04/30/13 2312     Chief Complaint  Patient presents with  . Drug Overdose   (Consider location/radiation/quality/duration/timing/severity/associated sxs/prior Treatment) Patient is a 37 y.o. female presenting with Overdose. The history is provided by the EMS personnel. The history is limited by the condition of the patient.  Drug Overdose This is a new problem. The current episode started 1 to 2 hours ago. The problem occurs constantly. The problem has been gradually worsening. Associated symptoms comments: Per ems at the house pt was combative and spitting and hitting and was given geodon.  On arrival here pt is unresponsive.  Per ems pt took 90 2mg  xanax about 2 hours ago.  Prior hx of OD and heroin addiction.. Nothing aggravates the symptoms. Nothing relieves the symptoms. The treatment provided no relief.    Past Medical History  Diagnosis Date  . Seizures   . Back ache   . HIV (human immunodeficiency virus infection)   . Hepatitis   . DDD (degenerative disc disease)   . Pancreatitis    Past Surgical History  Procedure Laterality Date  . Back surgery     Family History  Problem Relation Age of Onset  . Hypertension Father   . Hypertension Paternal Grandmother   . Hypertension Paternal Grandfather    History  Substance Use Topics  . Smoking status: Current Every Day Smoker  . Smokeless tobacco: Not on file  . Alcohol Use: Yes     Comment: occasional   OB History   Grav Para Term Preterm Abortions TAB SAB Ect Mult Living                 Review of Systems  Unable to perform ROS   Allergies  Sulfa antibiotics  Home Medications   Current Outpatient Rx  Name  Route  Sig  Dispense  Refill  . alprazolam (XANAX) 2 MG tablet   Oral   Take 2 mg by mouth 3 (three) times daily as needed. For anxiety         . oxycodone (ROXICODONE) 30 MG immediate release  tablet   Oral   Take 30 mg by mouth every 4 (four) hours as needed for pain.          BP 101/66  Pulse 93  Temp(Src) 97.8 F (36.6 C) (Rectal)  Resp 16  SpO2 100% Physical Exam  Nursing note and vitals reviewed. Constitutional: She appears well-developed and well-nourished. No distress.  HENT:  Head: Normocephalic and atraumatic.  Mouth/Throat: Oropharynx is clear and moist.  Eyes: Conjunctivae and EOM are normal. Pupils are equal, round, and reactive to light.  Neck: Normal range of motion. Neck supple.  Cardiovascular: Normal rate, regular rhythm and intact distal pulses.   No murmur heard. Pulmonary/Chest: Effort normal and breath sounds normal. No respiratory distress. She has no wheezes. She has no rales.  Abdominal: Soft. She exhibits no distension. There is no tenderness. There is no rebound and no guarding.  Musculoskeletal: Normal range of motion. She exhibits no edema and no tenderness.  Neurological: She is unresponsive.  Does not respond to painful stimuli and no gag reflex  Skin: Skin is warm and dry. No rash noted. No erythema.  Psychiatric: She has a normal mood and affect. Her behavior is normal.    ED Course  Procedures (including critical care time) Labs Review Labs Reviewed  CBC WITH  DIFFERENTIAL - Abnormal; Notable for the following:    RBC 3.85 (*)    MCH 34.8 (*)    Eosinophils Relative 7 (*)    All other components within normal limits  COMPREHENSIVE METABOLIC PANEL - Abnormal; Notable for the following:    Potassium 3.4 (*)    Total Bilirubin <0.1 (*)    All other components within normal limits  URINALYSIS, ROUTINE W REFLEX MICROSCOPIC - Abnormal; Notable for the following:    Specific Gravity, Urine 1.004 (*)    All other components within normal limits  ETHANOL - Abnormal; Notable for the following:    Alcohol, Ethyl (B) 489 (*)    All other components within normal limits  URINE RAPID DRUG SCREEN (HOSP PERFORMED) - Abnormal; Notable for  the following:    Benzodiazepines POSITIVE (*)    All other components within normal limits  SALICYLATE LEVEL - Abnormal; Notable for the following:    Salicylate Lvl <2.0 (*)    All other components within normal limits  CG4 I-STAT (LACTIC ACID) - Abnormal; Notable for the following:    Lactic Acid, Venous 3.43 (*)    All other components within normal limits  ACETAMINOPHEN LEVEL  PREGNANCY, URINE   Imaging Review Ct Head Wo Contrast  05/01/2013   CLINICAL DATA:  Overdose.  EXAM: CT HEAD WITHOUT CONTRAST  TECHNIQUE: Contiguous axial images were obtained from the base of the skull through the vertex without contrast.  COMPARISON:  CT head 03/06/12.  FINDINGS: Small left frontal scalp hematoma could be residua from previous much larger hematoma in October. No acute stroke or hemorrhage. No mass lesion or hydrocephalus. No extra-axial fluid. Calvarium intact. No sinus fluid. Premature for age cerebral atrophy. Negative orbits.  IMPRESSION: Premature for age cerebral atrophy. No acute intracranial abnormality.  Small left frontal scalp hematoma could be residual from prior much larger hematoma in October. Correlate clinically.   Electronically Signed   By: Davonna Belling M.D.   On: 05/01/2013 00:08   Dg Chest Port 1 View  05/01/2013   CLINICAL DATA:  Overdose.  Patient unresponsive.  EXAM: PORTABLE CHEST - 1 VIEW  COMPARISON:  None.  FINDINGS: Low lung volumes. Mild bibasilar atelectasis. No aspiration pneumonia is evident. Normal cardiac size for the degree of inspiration. No osseous findings.  IMPRESSION: Low lung volumes with mild bibasilar atelectasis. No evidence at this time for aspiration pneumonia.   Electronically Signed   By: Davonna Belling M.D.   On: 05/01/2013 00:24    EKG Interpretation    Date/Time:  Saturday April 30 2013 23:18:28 EST Ventricular Rate:  91 PR Interval:  164 QRS Duration: 77 QT Interval:  380 QTC Calculation: 467 R Axis:   87 Text Interpretation:  Sinus  rhythm Normal ECG Confirmed by Anitra Lauth  MD, Pheng Prokop ([id number]) on 04/30/2013 11:24:32 PM            MDM   1. Benzodiazepine overdose, initial encounter   2. Alcohol intoxication   3. Altered mental status     Patient presents via EMS with overdose. EMS patient's setup 180 mg of Xanax approximately 2-1/2 hours ago because of a fight with her boyfriend. Initially upon arrival there she was combative with EMS and received Haldol. The patient also has a heroin addiction and is prescribed oxycodone 30 mg instant release tablets. Upon arrival patient had no gag reflex and was not responsive. 2 mg of Narcan was given and patient would open eyes and her gag reflex returns. Patient  placed on a Narcan drip is unclear if she also took an overdose of oxycodone.    EKG is within normal limits. Portable chest x-ray pending.  CBC, CMP, UA, EtOH, UDS, acetaminophen, salicylates levels and head CT pending.  Watch closely to ensure no decrease in mental status requiring intubation the patient stable at this time  1:21 AM Labs with elevated lactate and then high EtOH and positive for benzos.  Narcan gtt still going and pt is intermittently responsive.  Will admit to stepdown for further care.  CRITICAL CARE Performed by: Gwyneth Sprout Total critical care time: 45 Critical care time was exclusive of separately billable procedures and treating other patients. Critical care was necessary to treat or prevent imminent or life-threatening deterioration. Critical care was time spent personally by me on the following activities: development of treatment plan with patient and/or surrogate as well as nursing, discussions with consultants, evaluation of patient's response to treatment, examination of patient, obtaining history from patient or surrogate, ordering and performing treatments and interventions, ordering and review of laboratory studies, ordering and review of radiographic studies, pulse oximetry and  re-evaluation of patient's condition.  Gwyneth Sprout, MD 05/01/13 4540  Gwyneth Sprout, MD 05/01/13 9811

## 2013-04-30 NOTE — ED Notes (Signed)
Bed: RESA Expected date:  Expected time:  Means of arrival:  Comments: EMS/overdose of 90 xanax-combative/Haldol per EMS

## 2013-04-30 NOTE — ED Notes (Signed)
ems called to house by police,  Stating per pt's boyfriend that she overdosed on 90 xanax,  Pt had new prescription filled yesterday

## 2013-04-30 NOTE — ED Notes (Signed)
Pt didn't have a gag reflex,  Pt given per verbal order  Narcan 2 mg IV,  Pt opened eyes and now has gag reflex.  No intubation at this time

## 2013-04-30 NOTE — ED Notes (Signed)
Notified EDP, Plunkett,MD. Pt. i-stat CG4 lactic acid results 3.43.

## 2013-05-01 ENCOUNTER — Encounter (HOSPITAL_COMMUNITY): Payer: Self-pay | Admitting: Internal Medicine

## 2013-05-01 DIAGNOSIS — R569 Unspecified convulsions: Secondary | ICD-10-CM | POA: Diagnosis present

## 2013-05-01 DIAGNOSIS — T43591A Poisoning by other antipsychotics and neuroleptics, accidental (unintentional), initial encounter: Secondary | ICD-10-CM

## 2013-05-01 DIAGNOSIS — F1010 Alcohol abuse, uncomplicated: Secondary | ICD-10-CM

## 2013-05-01 DIAGNOSIS — R4182 Altered mental status, unspecified: Secondary | ICD-10-CM

## 2013-05-01 DIAGNOSIS — F1110 Opioid abuse, uncomplicated: Secondary | ICD-10-CM | POA: Diagnosis present

## 2013-05-01 DIAGNOSIS — B20 Human immunodeficiency virus [HIV] disease: Secondary | ICD-10-CM | POA: Diagnosis present

## 2013-05-01 DIAGNOSIS — F1310 Sedative, hypnotic or anxiolytic abuse, uncomplicated: Secondary | ICD-10-CM

## 2013-05-01 DIAGNOSIS — T424X4A Poisoning by benzodiazepines, undetermined, initial encounter: Principal | ICD-10-CM

## 2013-05-01 DIAGNOSIS — T50901A Poisoning by unspecified drugs, medicaments and biological substances, accidental (unintentional), initial encounter: Secondary | ICD-10-CM

## 2013-05-01 LAB — URINALYSIS, ROUTINE W REFLEX MICROSCOPIC
Glucose, UA: NEGATIVE mg/dL
Hgb urine dipstick: NEGATIVE
Ketones, ur: NEGATIVE mg/dL
Leukocytes, UA: NEGATIVE
Nitrite: NEGATIVE
Specific Gravity, Urine: 1.004 — ABNORMAL LOW (ref 1.005–1.030)
pH: 5.5 (ref 5.0–8.0)

## 2013-05-01 LAB — RAPID URINE DRUG SCREEN, HOSP PERFORMED
Amphetamines: NOT DETECTED
Barbiturates: NOT DETECTED
Benzodiazepines: POSITIVE — AB
Cocaine: NOT DETECTED
Opiates: NOT DETECTED

## 2013-05-01 LAB — MRSA PCR SCREENING: MRSA by PCR: NEGATIVE

## 2013-05-01 LAB — COMPREHENSIVE METABOLIC PANEL
Albumin: 4.1 g/dL (ref 3.5–5.2)
Alkaline Phosphatase: 75 U/L (ref 39–117)
BUN: 8 mg/dL (ref 6–23)
Calcium: 9 mg/dL (ref 8.4–10.5)
Creatinine, Ser: 0.53 mg/dL (ref 0.50–1.10)
Potassium: 3.4 mEq/L — ABNORMAL LOW (ref 3.5–5.1)
Total Bilirubin: <0.1 mg/dL — ABNORMAL LOW (ref 0.3–1.2)
Total Protein: 7.7 g/dL (ref 6.0–8.3)

## 2013-05-01 LAB — ETHANOL: Alcohol, Ethyl (B): 489 mg/dL (ref 0–11)

## 2013-05-01 LAB — PREGNANCY, URINE: Preg Test, Ur: NEGATIVE

## 2013-05-01 MED ORDER — CHLORHEXIDINE GLUCONATE 0.12 % MT SOLN
15.0000 mL | Freq: Two times a day (BID) | OROMUCOSAL | Status: DC
  Administered 2013-05-01 – 2013-05-02 (×2): 15 mL via OROMUCOSAL
  Filled 2013-05-01 (×2): qty 15

## 2013-05-01 MED ORDER — DEXTROSE-NACL 5-0.9 % IV SOLN
INTRAVENOUS | Status: DC
  Administered 2013-05-01: 21:00:00 via INTRAVENOUS
  Administered 2013-05-01: 1000 mL via INTRAVENOUS
  Administered 2013-05-01 – 2013-05-02 (×2): via INTRAVENOUS

## 2013-05-01 MED ORDER — SODIUM CHLORIDE 0.9 % IV BOLUS (SEPSIS)
1000.0000 mL | Freq: Once | INTRAVENOUS | Status: AC
  Administered 2013-05-01: 1000 mL via INTRAVENOUS

## 2013-05-01 MED ORDER — ADULT MULTIVITAMIN W/MINERALS CH
1.0000 | ORAL_TABLET | Freq: Every day | ORAL | Status: DC
  Administered 2013-05-01: 1 via ORAL
  Filled 2013-05-01 (×2): qty 1

## 2013-05-01 MED ORDER — LORAZEPAM 1 MG PO TABS
1.0000 mg | ORAL_TABLET | Freq: Four times a day (QID) | ORAL | Status: DC | PRN
  Administered 2013-05-02: 1 mg via ORAL
  Filled 2013-05-01: qty 1

## 2013-05-01 MED ORDER — POTASSIUM CHLORIDE CRYS ER 20 MEQ PO TBCR
40.0000 meq | EXTENDED_RELEASE_TABLET | Freq: Once | ORAL | Status: AC
  Administered 2013-05-01: 40 meq via ORAL
  Filled 2013-05-01: qty 2

## 2013-05-01 MED ORDER — NICOTINE 14 MG/24HR TD PT24
14.0000 mg | MEDICATED_PATCH | Freq: Every day | TRANSDERMAL | Status: DC
  Administered 2013-05-01 – 2013-05-03 (×3): 14 mg via TRANSDERMAL
  Filled 2013-05-01 (×3): qty 1

## 2013-05-01 MED ORDER — THIAMINE HCL 100 MG/ML IJ SOLN
100.0000 mg | Freq: Every day | INTRAMUSCULAR | Status: DC
  Filled 2013-05-01 (×2): qty 1

## 2013-05-01 MED ORDER — PNEUMOCOCCAL VAC POLYVALENT 25 MCG/0.5ML IJ INJ
0.5000 mL | INJECTION | INTRAMUSCULAR | Status: AC
  Administered 2013-05-02: 0.5 mL via INTRAMUSCULAR
  Filled 2013-05-01 (×2): qty 0.5

## 2013-05-01 MED ORDER — KETOROLAC TROMETHAMINE 30 MG/ML IJ SOLN
30.0000 mg | Freq: Three times a day (TID) | INTRAMUSCULAR | Status: DC | PRN
  Administered 2013-05-01 – 2013-05-03 (×6): 30 mg via INTRAVENOUS
  Filled 2013-05-01 (×6): qty 1

## 2013-05-01 MED ORDER — ONDANSETRON HCL 4 MG/2ML IJ SOLN: 4.0000 mg | Freq: Four times a day (QID) | INTRAMUSCULAR | Status: DC | PRN

## 2013-05-01 MED ORDER — OXYCODONE HCL 5 MG PO TABS
15.0000 mg | ORAL_TABLET | ORAL | Status: DC | PRN
  Administered 2013-05-01 – 2013-05-03 (×11): 15 mg via ORAL
  Filled 2013-05-01 (×13): qty 3

## 2013-05-01 MED ORDER — BIOTENE DRY MOUTH MT LIQD
15.0000 mL | Freq: Two times a day (BID) | OROMUCOSAL | Status: DC
  Administered 2013-05-01 – 2013-05-02 (×4): 15 mL via OROMUCOSAL

## 2013-05-01 MED ORDER — HYDRALAZINE HCL 20 MG/ML IJ SOLN
10.0000 mg | Freq: Once | INTRAMUSCULAR | Status: AC
  Administered 2013-05-01: 10 mg via INTRAVENOUS
  Filled 2013-05-01: qty 1

## 2013-05-01 MED ORDER — FOLIC ACID 1 MG PO TABS
1.0000 mg | ORAL_TABLET | Freq: Every day | ORAL | Status: DC
  Administered 2013-05-01 – 2013-05-03 (×3): 1 mg via ORAL
  Filled 2013-05-01 (×3): qty 1

## 2013-05-01 MED ORDER — ONDANSETRON HCL 4 MG PO TABS
4.0000 mg | ORAL_TABLET | Freq: Four times a day (QID) | ORAL | Status: DC | PRN
  Administered 2013-05-03 (×2): 4 mg via ORAL
  Filled 2013-05-01 (×2): qty 1

## 2013-05-01 MED ORDER — VITAMIN B-1 100 MG PO TABS
100.0000 mg | ORAL_TABLET | Freq: Every day | ORAL | Status: DC
  Administered 2013-05-01 – 2013-05-03 (×3): 100 mg via ORAL
  Filled 2013-05-01 (×3): qty 1

## 2013-05-01 MED ORDER — SODIUM CHLORIDE 0.9 % IJ SOLN
3.0000 mL | Freq: Two times a day (BID) | INTRAMUSCULAR | Status: DC
  Administered 2013-05-02 – 2013-05-03 (×2): 3 mL via INTRAVENOUS

## 2013-05-01 MED ORDER — OXYCODONE HCL 5 MG PO TABS: 15.0000 mg | ORAL_TABLET | Freq: Four times a day (QID) | ORAL | Status: DC | PRN

## 2013-05-01 MED ORDER — INFLUENZA VAC SPLIT QUAD 0.5 ML IM SUSP
0.5000 mL | INTRAMUSCULAR | Status: AC
  Administered 2013-05-02: 0.5 mL via INTRAMUSCULAR
  Filled 2013-05-01 (×2): qty 0.5

## 2013-05-01 MED ORDER — LORAZEPAM 2 MG/ML IJ SOLN
1.0000 mg | Freq: Four times a day (QID) | INTRAMUSCULAR | Status: DC | PRN
  Administered 2013-05-01: 1 mg via INTRAVENOUS
  Filled 2013-05-01: qty 1

## 2013-05-01 MED ORDER — ENOXAPARIN SODIUM 40 MG/0.4ML ~~LOC~~ SOLN
40.0000 mg | SUBCUTANEOUS | Status: DC
  Administered 2013-05-01 – 2013-05-03 (×3): 40 mg via SUBCUTANEOUS
  Filled 2013-05-01 (×3): qty 0.4
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
## 2013-05-01 NOTE — ED Notes (Signed)
Continuous rounding on pt,  Pt has been given numerous blankets for comfort

## 2013-05-01 NOTE — Progress Notes (Signed)
Dr. Suanne Marker called and notified of patient's c/o chronic back pain and request for pain medicine. Toradol ordered and initiated. Xzavior Reinig, Georga Hacking, RN
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
## 2013-05-01 NOTE — ED Notes (Signed)
Spoke with Revonda Standard at poison control,   She is aware of pt labs.

## 2013-05-02 ENCOUNTER — Encounter (HOSPITAL_COMMUNITY): Payer: Self-pay

## 2013-05-02 ENCOUNTER — Telehealth: Payer: Self-pay | Admitting: Physician Assistant

## 2013-05-02 LAB — BASIC METABOLIC PANEL
CO2: 22 mEq/L (ref 19–32)
Chloride: 105 mEq/L (ref 96–112)
Creatinine, Ser: 0.53 mg/dL (ref 0.50–1.10)

## 2013-05-02 MED ORDER — HYDROXYZINE HCL 25 MG PO TABS
25.0000 mg | ORAL_TABLET | Freq: Four times a day (QID) | ORAL | Status: DC | PRN
  Filled 2013-05-02: qty 1

## 2013-05-02 MED ORDER — CHLORDIAZEPOXIDE HCL 25 MG PO CAPS: 25.0000 mg | ORAL_CAPSULE | Freq: Every day | ORAL | Status: DC

## 2013-05-02 MED ORDER — LOPERAMIDE HCL 2 MG PO CAPS: 2.0000 mg | ORAL_CAPSULE | ORAL | Status: DC | PRN

## 2013-05-02 MED ORDER — CHLORDIAZEPOXIDE HCL 25 MG PO CAPS
25.0000 mg | ORAL_CAPSULE | Freq: Three times a day (TID) | ORAL | Status: DC
  Administered 2013-05-03 (×2): 25 mg via ORAL
  Filled 2013-05-02 (×2): qty 1

## 2013-05-02 MED ORDER — VITAMIN B-1 100 MG PO TABS: 100.0000 mg | ORAL_TABLET | Freq: Every day | ORAL | Status: DC

## 2013-05-02 MED ORDER — CHLORDIAZEPOXIDE HCL 25 MG PO CAPS
25.0000 mg | ORAL_CAPSULE | Freq: Four times a day (QID) | ORAL | Status: AC
  Administered 2013-05-02 (×4): 25 mg via ORAL
  Filled 2013-05-02 (×4): qty 1

## 2013-05-02 MED ORDER — CHLORDIAZEPOXIDE HCL 25 MG PO CAPS: 25.0000 mg | ORAL_CAPSULE | ORAL | Status: DC

## 2013-05-02 MED ORDER — THIAMINE HCL 100 MG/ML IJ SOLN: 100.0000 mg | Freq: Once | INTRAMUSCULAR | Status: DC

## 2013-05-02 MED ORDER — HYDRALAZINE HCL 20 MG/ML IJ SOLN
10.0000 mg | Freq: Four times a day (QID) | INTRAMUSCULAR | Status: DC | PRN
  Administered 2013-05-02 – 2013-05-03 (×2): 10 mg via INTRAVENOUS
  Filled 2013-05-02 (×2): qty 1

## 2013-05-02 MED ORDER — ADULT MULTIVITAMIN W/MINERALS CH
1.0000 | ORAL_TABLET | Freq: Every day | ORAL | Status: DC
  Administered 2013-05-02 – 2013-05-03 (×2): 1 via ORAL
  Filled 2013-05-02 (×2): qty 1

## 2013-05-02 MED ORDER — ONDANSETRON 4 MG PO TBDP
4.0000 mg | ORAL_TABLET | Freq: Four times a day (QID) | ORAL | Status: DC | PRN
  Administered 2013-05-02: 4 mg via ORAL
  Filled 2013-05-02: qty 1

## 2013-05-02 MED ORDER — HYDRALAZINE HCL 20 MG/ML IJ SOLN
10.0000 mg | Freq: Once | INTRAMUSCULAR | Status: AC
  Administered 2013-05-02: 10 mg via INTRAVENOUS
  Filled 2013-05-02: qty 1

## 2013-05-02 MED ORDER — CHLORDIAZEPOXIDE HCL 25 MG PO CAPS
25.0000 mg | ORAL_CAPSULE | Freq: Four times a day (QID) | ORAL | Status: DC | PRN
  Administered 2013-05-03 (×3): 25 mg via ORAL
  Filled 2013-05-02 (×3): qty 1
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
## 2013-05-02 NOTE — Telephone Encounter (Signed)
Pt was seen this weekend for drug overdose. Per last note I suspected this and did not give her xanax or narcotics. I would like to officially dismiss pt from practice.

## 2013-05-02 NOTE — Progress Notes (Signed)
CARE MANAGEMENT NOTE 05/02/2013  Patient:  Alicia Hayes, Alicia Hayes   Account Number:  [id number]  Date Initiated:  05/02/2013  Documentation initiated by:  DAVIS,RHONDA  Subjective/Objective Assessment:   pt with overdose and hallucination.     Action/Plan:   psych suggest inpatient psych care on discharge   Anticipated DC Date:  05/05/2013   Anticipated DC Plan:  PSYCHIATRIC HOSPITAL  In-house referral  Clinical Social Worker  Artist      DC Planning Services  NA      [facility] Choice  NA   Choice offered to / List presented to:  NA   DME arranged  NA      DME agency  NA     HH arranged  NA      HH agency  NA   Status of service:  In process, will continue to follow Medicare Important Message given?  NA - LOS <3 / Initial given by admissions (If response is "NO", the following Medicare IM given date fields will be blank) Date Medicare IM given:   Date Additional Medicare IM given:    Discharge Disposition:    Per UR Regulation:  Reviewed for med. necessity/level of care/duration of stay  If discussed at Long Length of Stay Meetings, dates discussed:    Comments:  [id number]/Rhonda Stark Jock, BSN, [place] [phone] Chart Reviewed for discharge and hospital needs. Discharge needs at time of review:  None present will follow for needs. Review of patient progress due on [id number].

## 2013-05-02 NOTE — Progress Notes (Signed)
[facility] PROGRESS NOTE  Alicia Hayes OZH:[id number] DOB: [birth year] DOA: 04/30/2013 PCP: Tandy Gaw, PA-C  Assessment/Plan:  . Drug/Xanax overdose -Appreciate psychiatry assistance, inpatient psych recommended -BP stable today, maintaining air way of Narcan drip, no significant arrhythmias noted. -place on Librium protocol as per psychiatry recommendations -Transfer to telemetry bed with sitter .Altered mental status -Secondary to above  . HIV disease -On no meds, follow up outpatient  . h/o Seizure -Seizures free at this time, follow. . Narcotic abuse -Will need inpatient psych recommended per Dr. Lolly Mustache  Code Status: full Family Communication: none at bedside Disposition Plan: to [facility] when bed available   Consultants:  psych  Procedures:  none  Antibiotics:  none  HPI/Subjective: Complaining of right-sided pain, denies chest pain  Objective: Filed Vitals:   05/02/13 0800  BP: 138/107  Pulse: 97  Temp: 98.9 F (37.2 C)  Resp: 24    Intake/Output Summary (Last 24 hours) at 05/02/13 0927 Last data filed at 05/02/13 0900  Gross per 24 hour  Intake 4595.2 ml  Output   1750 ml  Net 2845.2 ml   Filed Weights   05/01/13 0400 05/02/13 0400  Weight: 59.9 kg (132 lb 0.9 oz) 64.8 kg (142 lb 13.7 oz)    Exam:  General: awake & oriented x 3 In NAD Cardiovascular: RRR, nl S1 s2 Respiratory: CTAB Abdomen: soft +BS NT/ND, no masses palpable Extremities: No cyanosis and no edema    Data Reviewed: Basic Metabolic Panel:  Recent Labs Lab 04/30/13 2320 05/02/13 0343  NA 139 136  K 3.4* 3.7  CL 99 105  CO2 22 22  GLUCOSE 92 107*  BUN 8 15  CREATININE 0.53 0.53  CALCIUM 9.0 8.6   Liver Function Tests:  Recent Labs Lab 04/30/13 2320  AST 33  ALT 16  ALKPHOS 75  BILITOT <0.1*  PROT 7.7  ALBUMIN 4.1   No results found for this basename: LIPASE, AMYLASE,  in the last 168 hours No results found for this basename: AMMONIA,  in  the last 168 hours CBC:  Recent Labs Lab 04/30/13 2320  WBC 6.2  NEUTROABS 2.7  HGB 13.4  HCT 37.6  MCV 97.7  PLT 234   Cardiac Enzymes: No results found for this basename: CKTOTAL, CKMB, CKMBINDEX, TROPONINI,  in the last 168 hours BNP (last 3 results) No results found for this basename: PROBNP,  in the last 8760 hours CBG: No results found for this basename: GLUCAP,  in the last 168 hours  Recent Results (from the past 240 hour(s))  MRSA PCR SCREENING     Status: None   Collection Time    05/01/13  3:36 AM      Result Value Range Status   MRSA by PCR NEGATIVE  NEGATIVE Final   Comment:            The GeneXpert MRSA Assay (FDA     approved for NASAL specimens     only), is one component of a     comprehensive MRSA colonization     surveillance program. It is not     intended to diagnose MRSA     infection nor to guide or     monitor treatment for     MRSA infections.     Studies: Ct Head Wo Contrast  05/01/2013   CLINICAL DATA:  Overdose.  EXAM: CT HEAD WITHOUT CONTRAST  TECHNIQUE: Contiguous axial images were obtained from the base of the skull through the vertex without contrast.  COMPARISON:  CT head 03/06/12.  FINDINGS: Small left frontal scalp hematoma could be residua from previous much larger hematoma in October. No acute stroke or hemorrhage. No mass lesion or hydrocephalus. No extra-axial fluid. Calvarium intact. No sinus fluid. Premature for age cerebral atrophy. Negative orbits.  IMPRESSION: Premature for age cerebral atrophy. No acute intracranial abnormality.  Small left frontal scalp hematoma could be residual from prior much larger hematoma in October. Correlate clinically.   Electronically Signed   By: Davonna Belling M.D.   On: 05/01/2013 00:08   Dg Chest Port 1 View  05/01/2013   CLINICAL DATA:  Overdose.  Patient unresponsive.  EXAM: PORTABLE CHEST - 1 VIEW  COMPARISON:  None.  FINDINGS: Low lung volumes. Mild bibasilar atelectasis. No aspiration  pneumonia is evident. Normal cardiac size for the degree of inspiration. No osseous findings.  IMPRESSION: Low lung volumes with mild bibasilar atelectasis. No evidence at this time for aspiration pneumonia.   Electronically Signed   By: Davonna Belling M.D.   On: 05/01/2013 00:24    Scheduled Meds: . antiseptic oral rinse  15 mL Mouth Rinse q12n4p  . chlordiazePOXIDE  25 mg Oral QID   Followed by  . [START ON 05/03/2013] chlordiazePOXIDE  25 mg Oral TID   Followed by  . [START ON 05/04/2013] chlordiazePOXIDE  25 mg Oral BH-qamhs   Followed by  . [START ON 05/05/2013] chlordiazePOXIDE  25 mg Oral Daily  . chlorhexidine  15 mL Mouth Rinse BID  . enoxaparin (LOVENOX) injection  40 mg Subcutaneous Q24H  . folic acid  1 mg Oral Daily  . influenza vac split quadrivalent PF  0.5 mL Intramuscular Tomorrow-1000  . multivitamin with minerals  1 tablet Oral Daily  . nicotine  14 mg Transdermal Daily  . pneumococcal 23 valent vaccine  0.5 mL Intramuscular Tomorrow-1000  . sodium chloride  3 mL Intravenous Q12H  . thiamine  100 mg Oral Daily   Continuous Infusions: . sodium chloride Stopped (05/01/13 0404)  . dextrose 5 % and 0.9% NaCl 125 mL/hr at 05/02/13 0600    Principal Problem:   Polysubstance overdose Active Problems:   HIV disease   Altered mental status   Seizure   Narcotic abuse    Time spent: >35    Alicia Hayes  [facility] Pager [phone]. If 7PM-7AM, please contact night-coverage at [email or web address], password [facility] 05/02/2013, 9:27 AM  LOS: 2 days

## 2013-05-03 ENCOUNTER — Encounter (HOSPITAL_COMMUNITY): Payer: Self-pay | Admitting: *Deleted

## 2013-05-03 ENCOUNTER — Inpatient Hospital Stay (HOSPITAL_COMMUNITY)
Admission: AD | Admit: 2013-05-03 | Discharge: 2013-05-07 | DRG: 897 | Disposition: A | Discharge status: Home / Self Care | Payer: Federal, State, Local not specified - Other | Source: Intra-hospital | Attending: Psychiatry | Admitting: Psychiatry

## 2013-05-03 DIAGNOSIS — IMO0002 Reserved for concepts with insufficient information to code with codable children: Secondary | ICD-10-CM | POA: Diagnosis present

## 2013-05-03 DIAGNOSIS — F321 Major depressive disorder, single episode, moderate: Secondary | ICD-10-CM | POA: Diagnosis present

## 2013-05-03 DIAGNOSIS — F1320 Sedative, hypnotic or anxiolytic dependence, uncomplicated: Secondary | ICD-10-CM

## 2013-05-03 DIAGNOSIS — F1020 Alcohol dependence, uncomplicated: Secondary | ICD-10-CM | POA: Diagnosis present

## 2013-05-03 DIAGNOSIS — B20 Human immunodeficiency virus [HIV] disease: Secondary | ICD-10-CM

## 2013-05-03 DIAGNOSIS — K759 Inflammatory liver disease, unspecified: Secondary | ICD-10-CM | POA: Diagnosis present

## 2013-05-03 DIAGNOSIS — F1994 Other psychoactive substance use, unspecified with psychoactive substance-induced mood disorder: Secondary | ICD-10-CM

## 2013-05-03 DIAGNOSIS — F1120 Opioid dependence, uncomplicated: Principal | ICD-10-CM

## 2013-05-03 DIAGNOSIS — Z8249 Family history of ischemic heart disease and other diseases of the circulatory system: Secondary | ICD-10-CM

## 2013-05-03 DIAGNOSIS — K5900 Constipation, unspecified: Secondary | ICD-10-CM | POA: Diagnosis present

## 2013-05-03 DIAGNOSIS — F17200 Nicotine dependence, unspecified, uncomplicated: Secondary | ICD-10-CM | POA: Diagnosis present

## 2013-05-03 DIAGNOSIS — R4182 Altered mental status, unspecified: Secondary | ICD-10-CM

## 2013-05-03 DIAGNOSIS — R569 Unspecified convulsions: Secondary | ICD-10-CM

## 2013-05-03 DIAGNOSIS — Z21 Asymptomatic human immunodeficiency virus [HIV] infection status: Secondary | ICD-10-CM | POA: Diagnosis present

## 2013-05-03 DIAGNOSIS — F411 Generalized anxiety disorder: Secondary | ICD-10-CM | POA: Diagnosis present

## 2013-05-03 DIAGNOSIS — F1010 Alcohol abuse, uncomplicated: Secondary | ICD-10-CM

## 2013-05-03 DIAGNOSIS — F1110 Opioid abuse, uncomplicated: Secondary | ICD-10-CM

## 2013-05-03 LAB — URINALYSIS, ROUTINE W REFLEX MICROSCOPIC
Hgb urine dipstick: NEGATIVE
Ketones, ur: NEGATIVE mg/dL
Nitrite: NEGATIVE
Protein, ur: NEGATIVE mg/dL
Specific Gravity, Urine: 1.005 (ref 1.005–1.030)
Urobilinogen, UA: 0.2 mg/dL (ref 0.0–1.0)

## 2013-05-03 LAB — CBC
HCT: 33.9 % — ABNORMAL LOW (ref 36.0–46.0)
MCH: 33.5 pg (ref 26.0–34.0)
MCHC: 33.6 g/dL (ref 30.0–36.0)
MCV: 99.7 fL (ref 78.0–100.0)
Platelets: 173 10*3/uL (ref 150–400)
RBC: 3.4 MIL/uL — ABNORMAL LOW (ref 3.87–5.11)

## 2013-05-03 MED ORDER — ALUM & MAG HYDROXIDE-SIMETH 200-200-20 MG/5ML PO SUSP: 30.0000 mL | ORAL | Status: DC | PRN

## 2013-05-03 MED ORDER — CHLORDIAZEPOXIDE HCL 25 MG PO CAPS
25.0000 mg | ORAL_CAPSULE | Freq: Four times a day (QID) | ORAL | Status: AC | PRN
  Administered 2013-05-05 – 2013-05-06 (×4): 25 mg via ORAL
  Filled 2013-05-03 (×3): qty 1

## 2013-05-03 MED ORDER — ATENOLOL 25 MG PO TABS
25.0000 mg | ORAL_TABLET | Freq: Every day | ORAL | Status: DC
  Administered 2013-05-04 – 2013-05-07 (×4): 25 mg via ORAL
  Filled 2013-05-03 (×7): qty 1

## 2013-05-03 MED ORDER — NICOTINE 14 MG/24HR TD PT24: 14.0000 mg | MEDICATED_PATCH | Freq: Every day | TRANSDERMAL | Status: DC

## 2013-05-03 MED ORDER — CHLORDIAZEPOXIDE HCL 25 MG PO CAPS: 25.0000 mg | ORAL_CAPSULE | ORAL | Status: DC

## 2013-05-03 MED ORDER — TRAZODONE HCL 50 MG PO TABS
50.0000 mg | ORAL_TABLET | Freq: Every evening | ORAL | Status: DC | PRN
  Administered 2013-05-03 – 2013-05-07 (×3): 50 mg via ORAL
  Filled 2013-05-03 (×5): qty 1
  Filled 2013-05-03: qty 28
  Filled 2013-05-03: qty 1
  Filled 2013-05-03: qty 28
  Filled 2013-05-03 (×2): qty 1
  Filled 2013-05-03: qty 28
  Filled 2013-05-03 (×3): qty 1
  Filled 2013-05-03: qty 28

## 2013-05-03 MED ORDER — CHLORDIAZEPOXIDE HCL 25 MG PO CAPS
25.0000 mg | ORAL_CAPSULE | ORAL | Status: AC
  Administered 2013-05-06 – 2013-05-07 (×2): 25 mg via ORAL
  Filled 2013-05-03 (×3): qty 1

## 2013-05-03 MED ORDER — VITAMIN B-1 100 MG PO TABS
100.0000 mg | ORAL_TABLET | Freq: Every day | ORAL | Status: DC
  Administered 2013-05-04 – 2013-05-07 (×4): 100 mg via ORAL
  Filled 2013-05-03 (×7): qty 1

## 2013-05-03 MED ORDER — LOPERAMIDE HCL 2 MG PO CAPS: 2.0000 mg | ORAL_CAPSULE | ORAL | Status: AC | PRN

## 2013-05-03 MED ORDER — MAGNESIUM HYDROXIDE 400 MG/5ML PO SUSP: 30.0000 mL | Freq: Every day | ORAL | Status: DC | PRN

## 2013-05-03 MED ORDER — HYDROXYZINE HCL 25 MG PO TABS: 25.0000 mg | ORAL_TABLET | Freq: Four times a day (QID) | ORAL | Status: AC | PRN

## 2013-05-03 MED ORDER — KETOROLAC TROMETHAMINE 30 MG/ML IJ SOLN
15.0000 mg | Freq: Once | INTRAMUSCULAR | Status: AC
  Administered 2013-05-03: 15 mg via INTRAMUSCULAR
  Filled 2013-05-03 (×2): qty 1

## 2013-05-03 MED ORDER — ADULT MULTIVITAMIN W/MINERALS CH: 1.0000 | ORAL_TABLET | Freq: Every day | ORAL | Status: DC

## 2013-05-03 MED ORDER — CHLORDIAZEPOXIDE HCL 25 MG PO CAPS
25.0000 mg | ORAL_CAPSULE | Freq: Three times a day (TID) | ORAL | Status: AC
  Administered 2013-05-05 – 2013-05-06 (×3): 25 mg via ORAL
  Filled 2013-05-03 (×3): qty 1

## 2013-05-03 MED ORDER — ATENOLOL 25 MG PO TABS: 25.0000 mg | ORAL_TABLET | Freq: Every day | ORAL | Status: DC

## 2013-05-03 MED ORDER — CHLORDIAZEPOXIDE HCL 25 MG PO CAPS: 25.0000 mg | ORAL_CAPSULE | Freq: Every day | ORAL | Status: DC

## 2013-05-03 MED ORDER — NICOTINE 14 MG/24HR TD PT24
14.0000 mg | MEDICATED_PATCH | Freq: Every day | TRANSDERMAL | Status: DC
  Administered 2013-05-04: 14 mg via TRANSDERMAL
  Filled 2013-05-03 (×4): qty 1

## 2013-05-03 MED ORDER — CHLORDIAZEPOXIDE HCL 25 MG PO CAPS
25.0000 mg | ORAL_CAPSULE | Freq: Every day | ORAL | Status: DC
  Filled 2013-05-03: qty 2

## 2013-05-03 MED ORDER — ADULT MULTIVITAMIN W/MINERALS CH
1.0000 | ORAL_TABLET | Freq: Every day | ORAL | Status: DC
  Administered 2013-05-04 – 2013-05-07 (×4): 1 via ORAL
  Filled 2013-05-03 (×7): qty 1

## 2013-05-03 MED ORDER — OXYCODONE HCL 15 MG PO TABS: 15.0000 mg | ORAL_TABLET | ORAL | Status: DC | PRN

## 2013-05-03 MED ORDER — CHLORDIAZEPOXIDE HCL 25 MG PO CAPS
25.0000 mg | ORAL_CAPSULE | Freq: Four times a day (QID) | ORAL | Status: AC
  Administered 2013-05-03 – 2013-05-05 (×6): 25 mg via ORAL
  Filled 2013-05-03 (×6): qty 1

## 2013-05-03 MED ORDER — THIAMINE HCL 100 MG/ML IJ SOLN: 100.0000 mg | Freq: Once | INTRAMUSCULAR | Status: DC

## 2013-05-03 MED ORDER — IBUPROFEN 600 MG PO TABS
600.0000 mg | ORAL_TABLET | Freq: Four times a day (QID) | ORAL | Status: DC | PRN
  Administered 2013-05-04 – 2013-05-06 (×3): 600 mg via ORAL
  Filled 2013-05-03 (×3): qty 1

## 2013-05-03 MED ORDER — FOLIC ACID 1 MG PO TABS: 1.0000 mg | ORAL_TABLET | Freq: Every day | ORAL | Status: DC

## 2013-05-03 MED ORDER — CHLORDIAZEPOXIDE HCL 25 MG PO CAPS: 25.0000 mg | ORAL_CAPSULE | Freq: Three times a day (TID) | ORAL | Status: DC

## 2013-05-03 MED ORDER — ONDANSETRON 4 MG PO TBDP: 4.0000 mg | ORAL_TABLET | Freq: Four times a day (QID) | ORAL | Status: AC | PRN

## 2013-05-03 MED ORDER — ACETAMINOPHEN 325 MG PO TABS
650.0000 mg | ORAL_TABLET | Freq: Four times a day (QID) | ORAL | Status: DC | PRN
  Administered 2013-05-05: 650 mg via ORAL
  Filled 2013-05-03: qty 2

## 2013-05-03 MED ORDER — METHOCARBAMOL 500 MG PO TABS
750.0000 mg | ORAL_TABLET | Freq: Four times a day (QID) | ORAL | Status: DC | PRN
  Administered 2013-05-03 – 2013-05-07 (×11): 750 mg via ORAL
  Filled 2013-05-03 (×11): qty 2

## 2013-05-03 NOTE — Tx Team (Signed)
Initial Interdisciplinary Treatment Plan  PATIENT STRENGTHS: (choose at least two) Ability for insight Communication skills General fund of knowledge Supportive family/friends  PATIENT STRESSORS: Health problems   PROBLEM LIST: Problem List/Patient Goals Date to be addressed Date deferred Reason deferred Estimated date of resolution  Back pain 05/03/13     OD/pt denies attempt 05/03/13                                                DISCHARGE CRITERIA:  Ability to meet basic life and health needs Improved stabilization in mood, thinking, and/or behavior Need for constant or close observation no longer present Reduction of life-threatening or endangering symptoms to within safe limits Verbal commitment to aftercare and medication compliance Withdrawal symptoms are absent or subacute and managed without 24-hour nursing intervention  PRELIMINARY DISCHARGE PLAN: Return to previous living arrangement  PATIENT/FAMIILY INVOLVEMENT: This treatment plan has been presented to and reviewed with the patient, Alicia Hayes, and/or family member,   The patient and family have been given the opportunity to ask questions and make suggestions.  Beatrix Shipper 05/03/2013, 7:07 PM
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
## 2013-05-03 NOTE — Progress Notes (Signed)
Clinical Social Work  Patient accepted to [facility] [id number]. RN to call report to [phone]. CSW spoke with patient who is not happy about going to [facility]. Patient feels that hospital "thinks I'm just crazy and I don't need to be locked up." Patient reports that she was not trying to kill herself. CSW explained that [facility] can assist with medication management and to assist with coping skills that are positive instead of relying on alcohol or drugs. Patient reports that she will sign the voluntary form because she does not want to be placed under IVC. CSW explained Juel Burrow will transport patient and explained DC process. CSW coordinated transportation via [employer]. CSW faxed voluntary form to [facility] and original copy will go with patient.  CSW is signing off but available if further needs arise.  [place], [place] [phone]
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
## 2013-05-03 NOTE — Plan of Care (Signed)
Problem: Phase I Progression Outcomes Goal: Pain controlled with appropriate interventions Outcome: Progressing Pt asks for pain med  Every 4 hours. Goal: Initial discharge plan identified Outcome: Progressing To  Be discharged to [facility].
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
## 2013-05-03 NOTE — Progress Notes (Signed)
Clinical Social Work  CSW spoke with MD who reports that patient is stable to transfer to [facility]. CSW sent referral to [facility] and spoke with admissions who reports unsure of bed status at this time. CSW will continue to follow and will assist with DC planning.  [place], [place] [phone]

## 2013-05-04 ENCOUNTER — Encounter (HOSPITAL_COMMUNITY): Payer: Self-pay | Admitting: Psychiatry

## 2013-05-04 DIAGNOSIS — F321 Major depressive disorder, single episode, moderate: Secondary | ICD-10-CM

## 2013-05-04 DIAGNOSIS — F1010 Alcohol abuse, uncomplicated: Secondary | ICD-10-CM

## 2013-05-04 DIAGNOSIS — F411 Generalized anxiety disorder: Secondary | ICD-10-CM

## 2013-05-04 DIAGNOSIS — F1120 Opioid dependence, uncomplicated: Secondary | ICD-10-CM | POA: Diagnosis present

## 2013-05-04 DIAGNOSIS — F1994 Other psychoactive substance use, unspecified with psychoactive substance-induced mood disorder: Secondary | ICD-10-CM

## 2013-05-04 DIAGNOSIS — F1320 Sedative, hypnotic or anxiolytic dependence, uncomplicated: Secondary | ICD-10-CM | POA: Diagnosis present

## 2013-05-04 LAB — URINE CULTURE: Colony Count: NO GROWTH

## 2013-05-04 MED ORDER — QUETIAPINE FUMARATE 50 MG PO TABS
50.0000 mg | ORAL_TABLET | Freq: Every evening | ORAL | Status: DC | PRN
  Administered 2013-05-04 – 2013-05-06 (×3): 50 mg via ORAL
  Filled 2013-05-04: qty 1
  Filled 2013-05-04: qty 28
  Filled 2013-05-04 (×2): qty 1

## 2013-05-04 MED ORDER — KETOROLAC TROMETHAMINE 10 MG PO TABS
10.0000 mg | ORAL_TABLET | Freq: Four times a day (QID) | ORAL | Status: DC
  Administered 2013-05-04 – 2013-05-07 (×12): 10 mg via ORAL
  Filled 2013-05-04 (×23): qty 1

## 2013-05-04 MED ORDER — CLONIDINE HCL 0.1 MG PO TABS
0.1000 mg | ORAL_TABLET | Freq: Two times a day (BID) | ORAL | Status: DC | PRN
  Administered 2013-05-04 – 2013-05-06 (×2): 0.1 mg via ORAL
  Filled 2013-05-04 (×2): qty 1

## 2013-05-04 MED ORDER — GABAPENTIN 300 MG PO CAPS
300.0000 mg | ORAL_CAPSULE | Freq: Three times a day (TID) | ORAL | Status: DC
  Administered 2013-05-04 – 2013-05-07 (×10): 300 mg via ORAL
  Filled 2013-05-04: qty 1
  Filled 2013-05-04: qty 42
  Filled 2013-05-04: qty 1
  Filled 2013-05-04: qty 42
  Filled 2013-05-04: qty 1
  Filled 2013-05-04: qty 42
  Filled 2013-05-04 (×10): qty 1
  Filled 2013-05-04 (×3): qty 42

## 2013-05-04 MED ORDER — LIDOCAINE 5 % EX PTCH
1.0000 | MEDICATED_PATCH | CUTANEOUS | Status: DC
  Administered 2013-05-04 – 2013-05-06 (×3): 1 via TRANSDERMAL
  Filled 2013-05-04 (×6): qty 1

## 2013-05-04 MED ORDER — KETOROLAC TROMETHAMINE 30 MG/ML IJ SOLN
15.0000 mg | Freq: Once | INTRAMUSCULAR | Status: AC
  Administered 2013-05-04: 15 mg via INTRAMUSCULAR
  Filled 2013-05-04 (×2): qty 1
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
## 2013-05-04 NOTE — Progress Notes (Signed)
D: Patient denies SI/HI/AVH. Patient has a anxious mood, flat affect.  Pt. Is rather fixated on medication therapy vs. Therapeutic communication.  She is has been interacting with others and has been appropriate.    A: Patient given emotional support from RN. Patient encouraged to come to staff with concerns and/or questions. Patient's medication routine continued. Patient's orders and plan of care reviewed.   R: Patient remains appropriate and cooperative. Will continue to monitor patient q15 minutes for safety.

## 2013-05-04 NOTE — Progress Notes (Signed)
Pt is new to the unit this evening.  Pt reports she has chronic pain and does not abuse her medications.  Pt says she needs her opiates for her DDD and chronic back pain.  Pt is insistent that she needs her medications.  Explained to pt the meds that were available to her tonight.  Informed pt that writer would call PA and inform him of her request/concerns.  Pt was ordered Toradol 15 mg IM for pain and Trazodone for sleep.  Pt was given ordered meds and encouraged to speak to the MD in the morning about her concerns.  Pt denies SI/HI/AV.  Support and encouragement offered.  Safety maintained with q15 minute checks.
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
## 2013-05-04 NOTE — [facility] Group Notes (Signed)
[facility] LCSW Group Therapy  05/04/2013 3:02 PM  Type of Therapy:  Group Therapy  Participation Level:  None-pt was asleep during group and did not participate. She does not demonstrate progress in the group setting at this time.   Smart, HeatherLCSWA 05/04/2013, 3:02 PM

## 2013-05-04 NOTE — Progress Notes (Signed)
Adult Psychoeducational Group Note  Date:  05/04/2013 Time:  11:00AM Group Topic/Focus:  Personal Development  Participation Level:  Active  Participation Quality:  Appropriate and Attentive  Affect:  Appropriate  Cognitive:  Alert and Appropriate  Insight: Appropriate  Engagement in Group:  Engaged  Modes of Intervention:  Discussion  Pt. Was attentive and appropriate during today's group discussion. Pt was able to discuss predator-Master or Illusion and identify values. Additional Comments:    April Manson 05/04/2013, 1:05 PM

## 2013-05-04 NOTE — Progress Notes (Signed)
Pt attended NA group and participated throughout the group  

## 2013-05-04 NOTE — Progress Notes (Signed)
Pt has been up and active in the milieu today.  She denies depression or hopelessness but rated her anxiety a 3 on her self-inventory.  She denies any S/H ideation or A/V/H.  She is c/o pain and some sweating and chills possibly from coming off opiates.  She did not have any opiates in her urine but was positive for benzo's and BAL was 489.  She requested pain medication at 0829 and was given her prn dose of ibuprofen and robaxin with little relief noted.  She was given Toradol 15 mg IM 0946 per md order.  At noon, she received her 10 mg tablet of Toradol and the increased amount of gabapentin to 300 mg 3x/day per md order.  In addition, she was started on prn clonidine 0.1mg  twice/day for symptoms of withdrawal which she did receive.

## 2013-05-04 NOTE — [facility] Group Notes (Signed)
[facility] LCSW Aftercare Discharge Planning Group Note   05/04/2013 9:27 AM  Participation Quality:  Pt pulled out of group by MD. CSW to complete PSA with pt and obtain information regarding aftercare plan/discharge.   Smart, HeatherLCSWA

## 2013-05-05 MED ORDER — MAGNESIUM CITRATE PO SOLN
1.0000 | Freq: Once | ORAL | Status: AC
  Administered 2013-05-05: 1 via ORAL

## 2013-05-05 MED ORDER — NICOTINE 21 MG/24HR TD PT24
21.0000 mg | MEDICATED_PATCH | Freq: Every day | TRANSDERMAL | Status: DC
  Administered 2013-05-05 – 2013-05-07 (×3): 21 mg via TRANSDERMAL
  Filled 2013-05-05 (×6): qty 1

## 2013-05-05 NOTE — [facility] Group Notes (Signed)
[facility] LCSW Group Therapy  05/05/2013 3:43 PM  Type of Therapy:  Group Therapy  Participation Level:  Did Not Attend-Pt in bed reported that she did not feel well/refused to attend group.   Smart, HeatherLCSWA  05/05/2013, 3:43 PM

## 2013-05-05 NOTE — Progress Notes (Signed)
Patient did attend the evening karaoke group. Pt was engaged, supportive, and participated by singing multiple songs and dancing.

## 2013-05-05 NOTE — Progress Notes (Signed)
The focus of this group is to educate the patient on the purpose and policies of crisis stabilization and provide a format to answer questions about their admission.  The group details unit policies and expectations of patients while admitted.  She was engaged in group and her goal for the day is "think and stay positive today"

## 2013-05-05 NOTE — Progress Notes (Signed)
[facility] MD Progress Note  05/05/2013 11:49 AM Alicia Hayes  MRN:  [id number]  Subjective: Alicia Hayes reports, "I'm craving cigarettes very badly. No one understands this cravings unless one has experience it. Overall, my mood is improving. I'm still saying that I did not try to take my own life. I just wanted to fall asleep. My sleep is fair, but I'm having some weird dreams. This happens to me whenever I'm coming off of opiates. I have problems using the bathroom doing #2. I get constipated a lot from using opiates. I believe that I can go home now. It is Christmas, and I have not wrapped any of my gifts. I'm not suicidal"  Diagnosis:   DSM5: Schizophrenia Disorders:  NA Obsessive-Compulsive Disorders:  NA Trauma-Stressor Disorders:  NA Substance/Addictive Disorders:  Opioid Disorder - Severe (304.00), Benzodiazepine dependence Depressive Disorders:  NA  Axis I: Opioid Disorder - Severe (304.00), Benzodiazepine dependence Axis II: Deferred Axis III:  Past Medical History  Diagnosis Date  . Seizures   . Back ache   . HIV (human immunodeficiency virus infection)   . Hepatitis   . DDD (degenerative disc disease)   . Pancreatitis    Axis IV: other psychosocial or environmental problems and Substance dependence Axis V: 41-50 serious symptoms  ADL's: Disheveled  Sleep: Fair  Appetite:  "Improving"  Suicidal Ideation:  Plan:  Denies Intent:  Denies Means:  Denies  Homicidal Ideation:  Plan:  Denies Intent:  Denies Means:  Denies  AEB (as evidenced by):  Psychiatric Specialty Exam: Review of Systems  Constitutional: Negative.   HENT: Negative.   Eyes: Negative.   Respiratory: Negative.   Cardiovascular: Negative.   Gastrointestinal: Positive for constipation.  Genitourinary: Negative.   Musculoskeletal: Negative.   Skin: Negative.   Neurological: Negative.   Endo/Heme/Allergies: Negative.   Psychiatric/Behavioral: Positive for substance abuse  (Opioid/benzodiazepine dependence). Negative for depression, suicidal ideas, hallucinations and memory loss. The patient is nervous/anxious (Currently being stabilized with medication) and has insomnia.     Blood pressure 127/93, pulse 108, temperature 97.2 F (36.2 C), temperature source Oral, resp. rate 16, height 5' 2.25" (1.581 m), weight 59.875 kg (132 lb), last menstrual period 03/03/2013, SpO2 96.00%.Body mass index is 23.95 kg/(m^2).  General Appearance: Disheveled  Eye Solicitor::  Fair  Speech:  Clear and Coherent  Volume:  Normal  Mood:  "Improving"  Affect:  Congruent  Thought Process:  Coherent and Goal Directed  Orientation:  Full (Time, Place, and Person)  Thought Content:  Rumination  Suicidal Thoughts:  No  Homicidal Thoughts:  No  Memory:  Immediate;   Good Recent;   Good Remote;   Good  Judgement:  Fair  Insight:  Fair  Psychomotor Activity:  Normal  Concentration:  Fair  Recall:  Good  Akathisia:  No  Handed:  Right  AIMS (if indicated):     Assets:  Desire for Improvement  Sleep:  Number of Hours: 6.25   Current Medications: Current Facility-Administered Medications  Medication Dose Route Frequency Provider Last Rate Last Dose  . acetaminophen (TYLENOL) tablet 650 mg  650 mg Oral Q6H PRN Kerry Hough, PA-C      . alum & mag hydroxide-simeth (MAALOX/MYLANTA) 200-200-20 MG/5ML suspension 30 mL  30 mL Oral Q4H PRN Kerry Hough, PA-C      . atenolol (TENORMIN) tablet 25 mg  25 mg Oral Daily Kerry Hough, PA-C   25 mg at 05/05/13 0820  . chlordiazePOXIDE (LIBRIUM) capsule 25 mg  25  mg Oral Q6H PRN Kerry Hough, PA-C      . chlordiazePOXIDE (LIBRIUM) capsule 25 mg  25 mg Oral TID Kerry Hough, PA-C       Followed by  . [START ON 05/06/2013] chlordiazePOXIDE (LIBRIUM) capsule 25 mg  25 mg Oral BH-qamhs Spencer E Simon, PA-C       Followed by  . [START ON 05/08/2013] chlordiazePOXIDE (LIBRIUM) capsule 25 mg  25 mg Oral Daily Kerry Hough, PA-C       . cloNIDine (CATAPRES) tablet 0.1 mg  0.1 mg Oral BID PRN Sanjuana Kava, NP   0.1 mg at 05/04/13 1150  . gabapentin (NEURONTIN) capsule 300 mg  300 mg Oral TID Rachael Fee, MD   300 mg at 05/05/13 8469  . hydrOXYzine (ATARAX/VISTARIL) tablet 25 mg  25 mg Oral Q6H PRN Kerry Hough, PA-C      . ibuprofen (ADVIL,MOTRIN) tablet 600 mg  600 mg Oral Q6H PRN Kerry Hough, PA-C   600 mg at 05/04/13 6295  . ketorolac (TORADOL) tablet 10 mg  10 mg Oral Q6H Rachael Fee, MD   10 mg at 05/05/13 [phone]  . lidocaine (LIDODERM) 5 % 1 patch  1 patch Transdermal Q24H Rachael Fee, MD   1 patch at 05/04/13 1955  . loperamide (IMODIUM) capsule 2-4 mg  2-4 mg Oral PRN Kerry Hough, PA-C      . magnesium citrate solution 1 Bottle  1 Bottle Oral Once Sanjuana Kava, NP      . magnesium hydroxide (MILK OF MAGNESIA) suspension 30 mL  30 mL Oral Daily PRN Kerry Hough, PA-C      . methocarbamol (ROBAXIN) tablet 750 mg  750 mg Oral Q6H PRN Kerry Hough, PA-C   750 mg at 05/05/13 0827  . multivitamin with minerals tablet 1 tablet  1 tablet Oral Daily Kerry Hough, PA-C   1 tablet at 05/05/13 0820  . nicotine (NICODERM CQ - dosed in mg/24 hours) patch 21 mg  21 mg Transdermal Q0600 Sanjuana Kava, NP   21 mg at 05/05/13 0820  . ondansetron (ZOFRAN-ODT) disintegrating tablet 4 mg  4 mg Oral Q6H PRN Kerry Hough, PA-C      . QUEtiapine (SEROQUEL) tablet 50 mg  50 mg Oral QHS PRN,MR X 1 Rachael Fee, MD   50 mg at 05/04/13 2144  . thiamine (B-1) injection 100 mg  100 mg Intramuscular Once [employer], PA-C      . thiamine (VITAMIN B-1) tablet 100 mg  100 mg Oral Daily Kerry Hough, PA-C   100 mg at 05/05/13 3244  . traZODone (DESYREL) tablet 50 mg  50 mg Oral QHS,MR X 1 Kerry Hough, PA-C   50 mg at 05/03/13 2108    Lab Results: No results found for this or any previous visit (from the past 48 hour(s)).  Physical Findings: AIMS: Facial and Oral Movements Muscles of Facial Expression: None,  normal Lips and Perioral Area: None, normal Jaw: None, normal Tongue: None, normal,Extremity Movements Upper (arms, wrists, hands, fingers): None, normal Lower (legs, knees, ankles, toes): None, normal, Trunk Movements Neck, shoulders, hips: None, normal, Overall Severity Severity of abnormal movements (highest score from questions above): None, normal Incapacitation due to abnormal movements: None, normal Patient's awareness of abnormal movements (rate only patient's report): No Awareness, Dental Status Current problems with teeth and/or dentures?: No Does patient usually wear dentures?: No  CIWA:  CIWA-Ar Total: 0 COWS:  COWS Total Score: 9  Treatment Plan Summary: Daily contact with patient to assess and evaluate symptoms and progress in treatment Medication management  Plan: Supportive approach/coping skills/relapse prevention. Magnesium citrate 1 bottle x 1 for constipation. Encouraged out of room, participation in group sessions and application of coping skills when distressed. Will continue to monitor response to/adverse effects of medications in use to assure effectiveness. Continue to monitor mood, behavior and interaction with staff and other patients. Continue current plan of care.  Medical Decision Making Problem Points:  New problem, with additional work-up planned (4), Review of last therapy session (1) and Review of psycho-social stressors (1) Data Points:  Review of medication regiment & side effects (2) Review of new medications or change in dosage (2)  I certify that inpatient services furnished can reasonably be expected to improve the patient's condition.   Armandina Stammer I, PMHNP, FNP-BC 05/05/2013, 11:49 AM

## 2013-05-05 NOTE — Progress Notes (Signed)
Patient ID: Alicia Hayes, female   DOB: 12-10-1975, 37 y.o.   MRN: [id number] Patient asleep; no s/s of distress noted at this time. Respirations regular and unlabored.

## 2013-05-05 NOTE — Progress Notes (Signed)
Adult Psychoeducational Group Note  Date:  05/05/2013 Time:  11:00AM Group Topic/Focus:  Leisure and Lifestyle Changes  Participation Level:  Active  Participation Quality:  Appropriate and Attentive  Affect:  Appropriate  Cognitive:  Alert and Appropriate  Insight: Appropriate  Engagement in Group:  Engaged  Modes of Intervention:  Discussion  Additional Comments:  Pt. Was attentive and appropriate during today's group discussion. Pt was able to complete self care worksheet out of today's Handbook. Pt was able to come up with the following words Yearn, Unconditional, and Positive.   Bing Plume D 05/05/2013, 1:49 PM

## 2013-05-05 NOTE — Progress Notes (Signed)
Pt denies any depression or hopelessness but did rate her anxiety a 2 on her self-inventory.  She denies any S/H ideation or A/V/H.  She was constipated and was given mag citrate with positive results today.  She is still having pain in her back and was given prn robaxin at 0827 along with her scheduled Toradol.  She does feel the toradol is helping her pain.

## 2013-05-05 NOTE — Progress Notes (Signed)
D   Pt is anxious and sad   She hs been active in groups and interacts well with others   She asks for  medications on a regular basis but has not been demanding   She denies symptoms of withdrawal but does have a hard time being still A   Verbal support given   Medications administered and effectiveness monitored   Discussed theraputic uses of medications   Q 15 min checks R   Pt safe at present

## 2013-05-05 NOTE — [facility] Suicide Risk Assessment (Signed)
[facility] INPATIENT:  Family/Significant Other Suicide Prevention Education  Suicide Prevention Education:  Patient Refusal for Family/Significant Other Suicide Prevention Education: The patient Alicia Hayes has refused to provide written consent for family/significant other to be provided Family/Significant Other Suicide Prevention Education during admission and/or prior to discharge.  Physician notified.  Pt stated that she does not have the number to her roommate and was unwilling to provide any other family contacts. SPE completed with pt. SPI pamphlet provided to pt and she was encouraged to share information with support network, ask questions, and talk about any concerns relating to SPE.   Smart, Karen Kinnard LCSWA  05/05/2013, 2:57 PM

## 2013-05-06 ENCOUNTER — Encounter: Payer: Self-pay | Admitting: *Deleted

## 2013-05-06 MED ORDER — DOCUSATE SODIUM 100 MG PO CAPS
100.0000 mg | ORAL_CAPSULE | Freq: Every day | ORAL | Status: DC
  Administered 2013-05-06 – 2013-05-07 (×2): 100 mg via ORAL
  Filled 2013-05-06 (×4): qty 1

## 2013-05-06 MED ORDER — GUAIFENESIN 100 MG/5ML PO SOLN
5.0000 mL | ORAL | Status: DC | PRN
  Administered 2013-05-06 – 2013-05-07 (×4): 100 mg via ORAL

## 2013-05-06 MED ORDER — LORATADINE 10 MG PO TABS
10.0000 mg | ORAL_TABLET | Freq: Every day | ORAL | Status: DC
  Administered 2013-05-06 – 2013-05-07 (×2): 10 mg via ORAL
  Filled 2013-05-06 (×4): qty 1

## 2013-05-06 NOTE — Progress Notes (Signed)
Patient ID: Alicia Hayes, female   DOB: 10/30/1975, 37 y.o.   MRN: [id number] D. Patient presents with anxious mood, affect congruent. She continues to complain of various withdrawal symptoms, including chills, tremors and anxiety. She states '' I feel terrible today, I'm also having body aches and I feel feverish and I've got a little cough. I think I'm coming down with something but I didn't have a fever. '' Patient also reports poor sleep and poor energy. Pt rates depression at 0/10 on depression scale, 10 being worst depressed, per her self inventory. A. Medications given as ordered. Support and encouragement provided. Encouraged po fluid intake, Gatorade given. R. Patient in no acute distress at this time. No further voiced concerns at this time. Will continue to monitor q 15 minutes for safety.

## 2013-05-06 NOTE — Tx Team (Signed)
Interdisciplinary Treatment Plan Update (Adult)  Date: 05/06/2013   Time Reviewed: 11:20 AM  Progress in Treatment:  Attending groups: Yes  Participating in groups:  Minimally  Taking medication as prescribed: Yes  Tolerating medication: Yes  Family/Significant othe contact made: No. SPE not required for this pt.   Patient understands diagnosis: Yes, AEB seeking treatment for ETOH/benzo detox and mood stabilization.  Discussing patient identified problems/goals with staff: Yes  Medical problems stabilized or resolved: Yes  Denies suicidal/homicidal ideation: Yes during admission, group, and self report.  Patient has not harmed self or Others: Yes  New problem(s) identified: n/a  Discharge Plan or Barriers: Pt to follow up at [facility] and [facility].  Additional comments: 37 Y/O female who is being prescribed Xanax, Oxycontin. States she ran out of Oxy as could not make the appointment as the MD was not available. States she went into withdrawal of opioids. She claims she started drinking to deal with the withdrawal. She claims she has been prescribed Xanax 2 mg QID. And Oxycontin 30 mg Q 4-6 HRS. She was found obtunded by her boyfriend. There ws the impression that she had taken all the Xanax she had as they found the bottle empty. She claims she divides her prescription up  Reason for Continuation of Hospitalization: Librium taper-withdrawals Mood stabilization Medication management  Estimated length of stay: 1-2 days (d/c Saturday if stable)  For review of initial/current patient goals, please see plan of care.  Attendees:  Patient:    Family:    Physician: Geoffery Lyons MD 05/06/2013 11:19 AM   Nursing: Renaldo Harrison RN  05/06/2013 11:19 AM   Clinical Social Worker Tiffane Sheldon Smart, LCSWA  05/06/2013 11:19 AM   Other:    Other:    Other:     Other:    Scribe for Treatment Team:  Trula Slade LCSWA 05/06/2013 11:20 AM

## 2013-05-06 NOTE — [facility] Group Notes (Signed)
[facility] LCSW Aftercare Discharge Planning Group Note   05/06/2013 9:31 AM  Participation Quality:  Minimal   Mood/Affect:  Depressed and Irritable  Depression Rating:  3  Anxiety Rating: 3   Thoughts of Suicide:  No Will you contract for safety?   NA  Current AVH:  No  Plan for Discharge/Comments:  Pt reports that she thinks she has a cold and feels "terrible this" morning. She reports high BP this morning, chills, and fever. Pt has follow-up at [facility] for med management and [facility] for therapy.   Transportation Means: mother  Supports: Technical brewer, HeatherLCSWA

## 2013-05-06 NOTE — Progress Notes (Signed)
Adult Psychoeducational Group Note  Date:  05/06/2013 Time:  2:02 PM  Group Topic/Focus:  Relapse Prevention Planning:   The focus of this group is to define relapse and discuss the need for planning to combat relapse.  Participation Level:  Active  Participation Quality:  Appropriate, Sharing and Supportive  Affect:  Anxious, Labile and Tearful  Cognitive:  Alert and Oriented  Insight: Improving  Engagement in Group:  Engaged  Modes of Intervention:  Discussion, Education and Support  Additional Comments:  Pt talked extensively about her current situation and how she doesn't know what to do. 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 Pt began to cry when talking about the losses in her life. After group pt went to lunch where she continued sharing. She became tearful and said "I think I'm having a nervous breakdown."   Reynolds Bowl 05/06/2013, 2:02 PM

## 2013-05-06 NOTE — [facility] Group Notes (Signed)
[facility] LCSW Group Therapy  05/06/2013 2:53 PM  Type of Therapy:  Group Therapy  Participation Level:  Minimal  Participation Quality:  Drowsy  Affect:  Depressed and Lethargic  Cognitive:  Lacking  Insight:  Limited  Engagement in Therapy:  Limited  Modes of Intervention:  Confrontation, Discussion, Education, Exploration, Limit-setting, Problem-solving, Rapport Building, Socialization and Support  Summary of Progress/Problems: Feelings around Relapse. Group members discussed the meaning of relapse and shared personal stories of relapse, how it affected them and others, and how they perceived themselves during this time. Group members were encouraged to identify triggers, warning signs and coping skills used when facing the possibility of relapse. Social supports were discussed and explored in detail. Post Acute Withdrawal Syndrome (handout provided) was introduced and examined. Pt's were encouraged to ask questions, talk about key points associated with PAWS, and process this information in terms of relapse prevention. Tyrianna was lethargic and stated that she did not feel well during group. She fell asleep within the first few minutes of group. Kerri does not show progress in the group setting at this time due to drowsiness and sickness "I have a bad cold."    Smart, HeatherLCSWA  05/06/2013, 2:53 PM

## 2013-05-06 NOTE — Progress Notes (Addendum)
Patient ID: Alicia Hayes, female   DOB: [birth year], 37 y.o.   MRN: [id number] [facility] MD Progress Note  05/06/2013 11:31 AM Alicia Hayes  MRN:  [id number]  Subjective: [name] reports, "Reports that she is feeling bad. Complained of coughing (non-productive),scratchy throat, chills and shakes. Says she did not know if the chills and shakes are coming from her withdrawal symptoms. However, she is afebrile at this time. Reports good result from the use of Magnesium Citrate yesterday.  Diagnosis:   DSM5: Schizophrenia Disorders:  NA Obsessive-Compulsive Disorders:  NA Trauma-Stressor Disorders:  NA Substance/Addictive Disorders:  Opioid Disorder - Severe (304.00), Benzodiazepine dependence Depressive Disorders:  NA  Axis I: Opioid Disorder - Severe (304.00), Benzodiazepine dependence Axis II: Deferred Axis III:  Past Medical History  Diagnosis Date  . Seizures   . Back ache   . HIV (human immunodeficiency virus infection)   . Hepatitis   . DDD (degenerative disc disease)   . Pancreatitis    Axis IV: other psychosocial or environmental problems and Substance dependence Axis V: 41-50 serious symptoms  ADL's: Disheveled  Sleep: Fair  Appetite:  "Improving"  Suicidal Ideation:  Plan:  Denies Intent:  Denies Means:  Denies  Homicidal Ideation:  Plan:  Denies Intent:  Denies Means:  Denies  AEB (as evidenced by):  Psychiatric Specialty Exam: Review of Systems  Constitutional: Negative.   HENT: Negative.   Eyes: Negative.   Respiratory: Negative.   Cardiovascular: Negative.   Gastrointestinal: Positive for constipation.  Genitourinary: Negative.   Musculoskeletal: Negative.   Skin: Negative.   Neurological: Negative.   Endo/Heme/Allergies: Negative.   Psychiatric/Behavioral: Positive for substance abuse (Opioid/benzodiazepine dependence). Negative for depression, suicidal ideas, hallucinations and memory loss. The patient is nervous/anxious (Currently being  stabilized with medication) and has insomnia.     Blood pressure 166/121, pulse 106, temperature 98.8 F (37.1 C), temperature source Oral, resp. rate 20, height 5' 2.25" (1.581 m), weight 59.875 kg (132 lb), last menstrual period 03/03/2013, SpO2 96.00%.Body mass index is 23.95 kg/(m^2).  General Appearance: Disheveled  Eye Solicitor::  Fair  Speech:  Clear and Coherent  Volume:  Normal  Mood:  "Improving"  Affect:  Congruent  Thought Process:  Coherent and Goal Directed  Orientation:  Full (Time, Place, and Person)  Thought Content:  Rumination  Suicidal Thoughts:  No  Homicidal Thoughts:  No  Memory:  Immediate;   Good Recent;   Good Remote;   Good  Judgement:  Fair  Insight:  Fair  Psychomotor Activity:  Normal  Concentration:  Fair  Recall:  Good  Akathisia:  No  Handed:  Right  AIMS (if indicated):     Assets:  Desire for Improvement  Sleep:  Number of Hours: 5.75   Current Medications: Current Facility-Administered Medications  Medication Dose Route Frequency Provider Last Rate Last Dose  . acetaminophen (TYLENOL) tablet 650 mg  650 mg Oral Q6H PRN Kerry Hough, PA-C   650 mg at 05/05/13 1832  . alum & mag hydroxide-simeth (MAALOX/MYLANTA) 200-200-20 MG/5ML suspension 30 mL  30 mL Oral Q4H PRN Kerry Hough, PA-C      . atenolol (TENORMIN) tablet 25 mg  25 mg Oral Daily Kerry Hough, PA-C   25 mg at 05/06/13 0802  . chlordiazePOXIDE (LIBRIUM) capsule 25 mg  25 mg Oral Q6H PRN Kerry Hough, PA-C   25 mg at 05/06/13 0539  . chlordiazePOXIDE (LIBRIUM) capsule 25 mg  25 mg Oral BH-qamhs Kerry Hough, PA-C  Followed by  . [START ON 05/08/2013] chlordiazePOXIDE (LIBRIUM) capsule 25 mg  25 mg Oral Daily Kerry Hough, PA-C      . cloNIDine (CATAPRES) tablet 0.1 mg  0.1 mg Oral BID PRN Sanjuana Kava, NP   0.1 mg at 05/06/13 0538  . gabapentin (NEURONTIN) capsule 300 mg  300 mg Oral TID Rachael Fee, MD   300 mg at 05/06/13 0801  . guaiFENesin (ROBITUSSIN)  100 MG/5ML solution 100 mg  5 mL Oral Q4H PRN Sanjuana Kava, NP      . hydrOXYzine (ATARAX/VISTARIL) tablet 25 mg  25 mg Oral Q6H PRN Kerry Hough, PA-C      . ibuprofen (ADVIL,MOTRIN) tablet 600 mg  600 mg Oral Q6H PRN Kerry Hough, PA-C   600 mg at 05/06/13 0802  . ketorolac (TORADOL) tablet 10 mg  10 mg Oral Q6H Rachael Fee, MD   10 mg at 05/06/13 0541  . lidocaine (LIDODERM) 5 % 1 patch  1 patch Transdermal Q24H Rachael Fee, MD   1 patch at 05/05/13 1829  . loperamide (IMODIUM) capsule 2-4 mg  2-4 mg Oral PRN Kerry Hough, PA-C      . loratadine (CLARITIN) tablet 10 mg  10 mg Oral Daily Sanjuana Kava, NP      . magnesium hydroxide (MILK OF MAGNESIA) suspension 30 mL  30 mL Oral Daily PRN Kerry Hough, PA-C      . methocarbamol (ROBAXIN) tablet 750 mg  750 mg Oral Q6H PRN Kerry Hough, PA-C   750 mg at 05/06/13 0541  . multivitamin with minerals tablet 1 tablet  1 tablet Oral Daily Kerry Hough, PA-C   1 tablet at 05/06/13 0801  . nicotine (NICODERM CQ - dosed in mg/24 hours) patch 21 mg  21 mg Transdermal Q0600 Sanjuana Kava, NP   21 mg at 05/06/13 0543  . ondansetron (ZOFRAN-ODT) disintegrating tablet 4 mg  4 mg Oral Q6H PRN Kerry Hough, PA-C      . QUEtiapine (SEROQUEL) tablet 50 mg  50 mg Oral QHS PRN,MR X 1 Rachael Fee, MD   50 mg at 05/05/13 2154  . thiamine (B-1) injection 100 mg  100 mg Intramuscular Once [employer], PA-C      . thiamine (VITAMIN B-1) tablet 100 mg  100 mg Oral Daily Kerry Hough, PA-C   100 mg at 05/06/13 0802  . traZODone (DESYREL) tablet 50 mg  50 mg Oral QHS,MR X 1 Kerry Hough, PA-C   50 mg at 05/03/13 2108    Lab Results: No results found for this or any previous visit (from the past 48 hour(s)).  Physical Findings: AIMS: Facial and Oral Movements Muscles of Facial Expression: None, normal Lips and Perioral Area: None, normal Jaw: None, normal Tongue: None, normal,Extremity Movements Upper (arms, wrists, hands,  fingers): None, normal Lower (legs, knees, ankles, toes): None, normal, Trunk Movements Neck, shoulders, hips: None, normal, Overall Severity Severity of abnormal movements (highest score from questions above): None, normal Incapacitation due to abnormal movements: None, normal Patient's awareness of abnormal movements (rate only patient's report): No Awareness, Dental Status Current problems with teeth and/or dentures?: No Does patient usually wear dentures?: No  CIWA:  CIWA-Ar Total: 15 COWS:  COWS Total Score: 9  Treatment Plan Summary: Daily contact with patient to assess and evaluate symptoms and progress in treatment Medication management  Plan: Supportive approach/coping skills/relapse prevention. Colace 100 mg  daily for constipation. Claritin 10 mg daily for allergies. Encouraged out of room, participation in group sessions and application of coping skills when distressed. Will continue to monitor response to/adverse effects of medications in use to assure effectiveness. Continue to monitor mood, behavior and interaction with staff and other patients. Continue current plan of care.  Medical Decision Making Problem Points:  New problem, with additional work-up planned (4), Review of last therapy session (1) and Review of psycho-social stressors (1) Data Points:  Review of medication regiment & side effects (2) Review of new medications or change in dosage (2)  I certify that inpatient services furnished can reasonably be expected to improve the patient's condition.   Armandina Stammer I, PMHNP, FNP-BC 05/06/2013, 11:31 AM Met with patient who expressed that if D/C tomorrow she plans to stay with her mother. She states she wants to do some Christmas shopping. She asked that if she did not feel safe to go tomorrow if she could stay another day. She was asked to talk to the MD tomorrow if she did not feel ready She already feels better after being given Claritin and Sudafed Kalena Mander A.  Dub Mikes, M.D.

## 2013-05-06 NOTE — Progress Notes (Signed)
Recreation Therapy Notes  Date: 12.18.2014 Time: 3:00pm Location: [address] Dayroom   Group Topic: Communication, Team Building, Problem Solving  Goal Area(s) Addresses:  Patient will effectively work with peer towards shared goal.  Patient will identify skill used to make activity successful.  Patient will identify how skills used during activity can be used to reach post d/c goals.   Behavioral Response: Engaged, Attentive, Appropriate  Intervention: Problem Solving Activitiy  Activity: Life Boat. Patients were given a scenario about being on a sinking yacht. Patients were informed the yacht included 15 guest, 8 of which could be placed on the life boat, along with all group members. Individuals on guest list were of varying socioeconomic classes such as a Education officer, museum, Materials engineer, Midwife, Tree surgeon.   Education: Pharmacist, community, Discharge Planning    Education Outcome: Acknowledges understanding  Clinical Observations/Feedback: Patient actively engaged in group activity, voicing his opinion and debating with peers appropriately. Patient contributed to group discussion identifying qualities that guided her decision making, as well as relating those skills to group skills used.   Marykay Lex Shiron Whetsel, LRT/CTRS  Jarita Raval L 05/06/2013 8:44 AM

## 2013-05-07 DIAGNOSIS — F1020 Alcohol dependence, uncomplicated: Secondary | ICD-10-CM

## 2013-05-07 MED ORDER — LIDOCAINE 5 % EX PTCH: 1.0000 | MEDICATED_PATCH | CUTANEOUS | Status: DC

## 2013-05-07 MED ORDER — ATENOLOL 25 MG PO TABS: 25.0000 mg | ORAL_TABLET | Freq: Every day | ORAL | Status: DC

## 2013-05-07 MED ORDER — CHLORDIAZEPOXIDE HCL 25 MG PO CAPS: 25.0000 mg | ORAL_CAPSULE | Freq: Every day | ORAL | Status: DC

## 2013-05-07 MED ORDER — TRAZODONE HCL 50 MG PO TABS: 50.0000 mg | ORAL_TABLET | Freq: Every evening | ORAL | Status: DC | PRN

## 2013-05-07 MED ORDER — KETOROLAC TROMETHAMINE 10 MG PO TABS: 10.0000 mg | ORAL_TABLET | Freq: Four times a day (QID) | ORAL | Status: DC

## 2013-05-07 MED ORDER — DSS 100 MG PO CAPS: 100.0000 mg | ORAL_CAPSULE | Freq: Every day | ORAL | Status: DC

## 2013-05-07 MED ORDER — QUETIAPINE FUMARATE 50 MG PO TABS: 50.0000 mg | ORAL_TABLET | Freq: Every evening | ORAL | Status: DC | PRN

## 2013-05-07 MED ORDER — GABAPENTIN 300 MG PO CAPS: 300.0000 mg | ORAL_CAPSULE | Freq: Three times a day (TID) | ORAL | Status: DC

## 2013-05-07 MED ORDER — METHOCARBAMOL 750 MG PO TABS: 750.0000 mg | ORAL_TABLET | Freq: Four times a day (QID) | ORAL | Status: DC | PRN

## 2013-05-07 MED ORDER — LORATADINE 10 MG PO TABS: 10.0000 mg | ORAL_TABLET | Freq: Every day | ORAL | Status: DC

## 2013-05-07 NOTE — Progress Notes (Signed)
Pt is for discharge today. She denies SI or HI and does contract for safety .Pt stated she does get lonely at home having to stay there from 11a -11p by herself. Discussed with pt other options to stay busy. She admitted she does have access to a car and her mom does come and visit. Pt was given some cough syrup for a cough this am. She does not appear short of breath.

## 2013-05-07 NOTE — Discharge Summary (Signed)
Physician Discharge Summary Note  Patient:  Alicia Hayes is an 37 y.o., female MRN:  [id number] DOB:  04/19/1976 Patient phone:  [phone] (home)  Patient address:   [address] [place] [address],   Date of Admission:  05/03/2013 Date of Discharge: 05/07/2013  Reason for Admission:  Benzodiazepine detox/withdrawal  Discharge Diagnoses: Active Problems:   Substance induced mood disorder   Opioid dependence   Benzodiazepine dependence   Alcohol abuse  Review of Systems  Constitutional: Negative.   HENT: Negative.   Eyes: Negative.   Respiratory: Negative.   Cardiovascular: Negative.   Gastrointestinal: Negative.   Genitourinary: Negative.   Musculoskeletal: Negative.   Skin: Negative.   Neurological: Negative.   Endo/Heme/Allergies: Negative.   Psychiatric/Behavioral: Positive for substance abuse.    DSM5:  Substance/Addictive Disorders:  Alcohol Related Disorder - Moderate (303.90), Alcohol Intoxication with Use Disorder - Moderate (F10.229), Opioid Disorder - Severe (304.00) and Opioid Intoxication (292.89)  Axis Diagnosis:   AXIS I:  Alcohol Abuse, Substance Abuse and Substance Induced Mood Disorder AXIS II:  Deferred AXIS III:   Past Medical History  Diagnosis Date  . Seizures   . Back ache   . HIV (human immunodeficiency virus infection)   . Hepatitis   . DDD (degenerative disc disease)   . Pancreatitis    AXIS IV:  other psychosocial or environmental problems, problems related to social environment and problems with primary support group AXIS V:  61-70 mild symptoms  Level of Care:  OP  Hospital Course:  On admission:  38 Y/O female who is being prescribed Xanax, Oxycontin. States she ran out of Oxy as could not make the appointment as the MD was not available. States she went into withdrawal of opioids. She claims she started drinking to deal with the withdrawal. She claims she has been prescribed Xanax 2 mg QID. And Oxycontin 30 mg Q 4-6 HRS.  She was found obtunded by her boyfriend. There ws the impression that she had taken all the Xanax she had as they found the bottle empty. She claims she divides her prescription up   During hospitalization:  Medications managed--Librium benzodiazepine detox protocol used successfully.  Her oxycodone and Xanax were discontinued.  Atenolol 25 mg daily for HTN, colace 100 mg daily for constipation, Lidoderm patches to back for pain, Gabapentin 300 mg TID for neuroptheic pain, Robaxin 750 mg every 6 hours PRN muscle spasms, Seroquel 50 mg for sleep at bedtime, Trazodone 50 mg for sleep at bedtime PRN, and Claritin 10 mg for allergies started.  [name] attended and participated in therapy.  She denied suicidal/homicidal ideations and auditory/visual hallucinations, follow-up appointments encouraged to attend, Rx given with 14 day supply of medications.  [name] is mentally and physically stable for discharge.  Consults:  None  Significant Diagnostic Studies:  labs: completed, reviewed, stable  Discharge Vitals:   Blood pressure 129/81, pulse 76, temperature 98.2 F (36.8 C), temperature source Oral, resp. rate 14, height 5' 2.25" (1.581 m), weight 59.875 kg (132 lb), last menstrual period 03/03/2013, SpO2 96.00%. Body mass index is 23.95 kg/(m^2). Lab Results:   No results found for this or any previous visit (from the past 72 hour(s)).  Physical Findings: AIMS: Facial and Oral Movements Muscles of Facial Expression: None, normal Lips and Perioral Area: None, normal Jaw: None, normal Tongue: None, normal,Extremity Movements Upper (arms, wrists, hands, fingers): None, normal Lower (legs, knees, ankles, toes): None, normal, Trunk Movements Neck, shoulders, hips: None, normal, Overall Severity Severity of abnormal  movements (highest score from questions above): None, normal Incapacitation due to abnormal movements: None, normal Patient's awareness of abnormal movements (rate only patient's report):  No Awareness, Dental Status Current problems with teeth and/or dentures?: No Does patient usually wear dentures?: No  CIWA:  CIWA-Ar Total: 8 COWS:  COWS Total Score: 9  Psychiatric Specialty Exam: See Psychiatric Specialty Exam and Suicide Risk Assessment completed by Attending Physician prior to discharge.  Discharge destination:  Home  Is patient on multiple antipsychotic therapies at discharge:  No   Has Patient had three or more failed trials of antipsychotic monotherapy by history:  No  Recommended Plan for Multiple Antipsychotic Therapies: NA  Discharge Orders   Future Orders Complete By Expires   Activity as tolerated - No restrictions  As directed    Diet - low sodium heart healthy  As directed        Medication List    STOP taking these medications       alprazolam 2 MG tablet  Commonly known as:  XANAX     oxycodone 30 MG immediate release tablet  Commonly known as:  ROXICODONE      TAKE these medications     Indication   atenolol 25 MG tablet  Commonly known as:  TENORMIN  Take 1 tablet (25 mg total) by mouth daily.   Indication:  High Blood Pressure     chlordiazePOXIDE 25 MG capsule  Commonly known as:  LIBRIUM  Take 1 capsule (25 mg total) by mouth daily.  Start taking on:  05/08/2013   Indication:  Acute Alcohol Withdrawal Syndrome     DSS 100 MG Caps  Take 100 mg by mouth daily.   Indication:  Constipation     gabapentin 300 MG capsule  Commonly known as:  NEURONTIN  Take 1 capsule (300 mg total) by mouth 3 (three) times daily.   Indication:  Neuropathic Pain     lidocaine 5 %  Commonly known as:  LIDODERM  Place 1 patch onto the skin daily. Remove & Discard patch within 12 hours or as directed by MD   Indication:  MSK pain     loratadine 10 MG tablet  Commonly known as:  CLARITIN  Take 1 tablet (10 mg total) by mouth daily.   Indication:  Perennial Rhinitis, Hayfever     methocarbamol 750 MG tablet  Commonly known as:  ROBAXIN   Take 1 tablet (750 mg total) by mouth every 6 (six) hours as needed for muscle spasms.   Indication:  Musculoskeletal Pain     QUEtiapine 50 MG tablet  Commonly known as:  SEROQUEL  Take 1 tablet (50 mg total) by mouth at bedtime as needed and may repeat dose one time if needed (sleep).   Indication:  Trouble Sleeping     traZODone 50 MG tablet  Commonly known as:  DESYREL  Take 1 tablet (50 mg total) by mouth at bedtime and may repeat dose one time if needed.   Indication:  Trouble Sleeping           Follow-up Information   Follow up with [facility]. (Walk in between 8am-9am Monday through Friday for hospital followup/medication management. )    Contact information:   [address], [place] [address] Phone: [phone] Fax: [phone]      Follow up with [facility]  On 05/18/2013. (Appt. at 10:00AM with Rudi Rummage for therapy. Agency will call you the day before you appt to remind you. )  Contact information:   [facility] [address] [place] [address] Phone: [phone] Fax: [phone]      Follow-up recommendations:  Activity:  as tolerated Diet:  low-sodium heart healthy diet  Comments:  Patient will continue her care at [facility].  Total Discharge Time:  Greater than 30 minutes.  SignedNanine Means, PMH-NP 05/07/2013, 9:14 AM  Patient was seen for psychiatric evaluation, suicide risk assessment and case discussed with the treatment team and formulated treatment plan and later made disposition plans. Reviewed the information documented and agree with the treatment plan.  Lariya Kinzie,JANARDHAHA R. 05/07/2013 3:07 PM

## 2013-05-07 NOTE — [facility] Group Notes (Signed)
[facility] Group Notes:  (Nursing/MHT/Case Management/Adjunct)  Date:  05/07/2013  Time:  9:46 AM  Type of Therapy:  Psychoeducational Skills  Participation Level:  Active  Participation Quality:  Appropriate  Affect:  Appropriate  Cognitive:  Alert  Insight:  Appropriate  Engagement in Group:  Engaged  Modes of Intervention:  Education  Summary of Progress/Problems:Pt stated she is ready for discharge.   Rodman Key [facility] 05/07/2013, 9:46 AM

## 2013-05-07 NOTE — Progress Notes (Signed)
[facility] Adult Case Management Discharge Plan :  Will you be returning to the same living situation after discharge: Yes,  per handoff At discharge, do you have transportation home?:Yes,  pick up Do you have the ability to pay for your medications:Yes,  Did not indicate issues  Release of information consent forms completed and in the chart;  Patient's signature needed at discharge.  Patient to Follow up at: Follow-up Information   Follow up with [facility]. (Walk in between 8am-9am Monday through Friday for hospital followup/medication management. )    Contact information:   [address] [place] [address] Phone: [phone] Fax: [phone]      Follow up with [facility]  On 05/18/2013. (Appt. at 10:00AM with Rudi Rummage for therapy. Agency will call you the day before you appt to remind you. )    Contact information:   [facility] [address], [place] [address] Phone: [phone] Fax: [phone]      Patient denies SI/HI:   Yes,  per doctor    Safety Planning and Suicide Prevention discussed:  Yes,  in weekday aftercare planning groups  Sarina Ser 05/07/2013, 5:04 PM

## 2013-05-07 NOTE — Progress Notes (Signed)
Patient ID: Alicia Hayes, female   DOB: [birth year], 37 y.o.   MRN: [id number] D)  Has been out and about on hall this evening, attended group, played cards before and after group with peers, interacting appropriately.  Has had multiple c/o's of pain and discomfort d/t detox.  Affect is flat,sad, disheveled.  Attended group, interacting appropriately with staff and peers. A)  Will continue to monitor for safety, continue POC R)  Safety maintained.

## 2013-05-07 NOTE — [facility] Suicide Risk Assessment (Signed)
Suicide Risk Assessment  Discharge Assessment     Demographic Factors:  Adolescent or young adult, Low socioeconomic status and Unemployed  Mental Status Per Nursing Assessment::   On Admission:  NA  Current Mental Status by Physician: The patient is calm and cooperative. Patient has normal psychomotor activity. Patient has fine mood and appropriate affect. Patient has normal speech and thought process. Patient has denied she said, homicidal ideations, intentions and plans. Patient has no evidence of psychotic symptoms. Patient has fair insight judgment and impulse control  Loss Factors: Financial problems/change in socioeconomic status  Historical Factors: Impulsivity  Risk Reduction Factors:   Sense of responsibility to family, Religious beliefs about death, Living with another person, especially a relative, Positive social support, Positive therapeutic relationship and Positive coping skills or problem solving skills  Continued Clinical Symptoms:  Alcohol/Substance Abuse/Dependencies Previous Psychiatric Diagnoses and Treatments  Cognitive Features That Contribute To Risk:  Polarized thinking    Suicide Risk:  Minimal: No identifiable suicidal ideation.  Patients presenting with no risk factors but with morbid ruminations; may be classified as minimal risk based on the severity of the depressive symptoms  Discharge Diagnoses:   AXIS I:  Substance Induced Mood Disorder and Polysubstance abuse AXIS II:  Deferred AXIS III:   Past Medical History  Diagnosis Date  . Seizures   . Back ache   . HIV (human immunodeficiency virus infection)   . Hepatitis   . DDD (degenerative disc disease)   . Pancreatitis    AXIS IV:  economic problems, occupational problems, other psychosocial or environmental problems, problems related to social environment and problems with primary support group AXIS V:  61-70 mild symptoms  Plan Of Care/Follow-up recommendations:  Activity:  As  tolerated Diet:  Regular  Is patient on multiple antipsychotic therapies at discharge:  No   Has Patient had three or more failed trials of antipsychotic monotherapy by history:  No  Recommended Plan for Multiple Antipsychotic Therapies: NA  Rainier Feuerborn,JANARDHAHA R. 05/07/2013, 12:26 PM

## 2013-05-10 NOTE — Progress Notes (Addendum)
Patient Discharge Instructions:  After Visit Summary (AVS):   Faxed to:  05/10/13 Discharge Summary Note:   Faxed to:  05/10/13 Psychiatric Admission Assessment Note:   Faxed to:  05/10/13 Suicide Risk Assessment - Discharge Assessment:   Faxed to:  05/10/13 Faxed/Sent to the Next [facility] Care provider:  05/10/13 Faxed to [facility] @ [phone] Faxed to [facility] @ [phone]  Jerelene Redden, 05/10/2013, 1:10 PM

## 2013-05-11 NOTE — Progress Notes (Signed)
[facility] Adult Case Management Discharge Plan :  Will you be returning to the same living situation after discharge: Yes,  home At discharge, do you have transportation home?:Yes,  family Do you have the ability to pay for your medications:Yes,  mental health  Release of information consent forms completed and in the chart;  Patient's signature needed at discharge.  Patient to Follow up at: Follow-up Information   Follow up with [facility]. (Walk in between 8am-9am Monday through Friday for hospital followup/medication management. )    Contact information:   [address], [place] [address] Phone: [phone] Fax: [phone]      Follow up with [facility]  On 05/18/2013. (Appt. at 10:00AM with Rudi Rummage for therapy. Agency will call you the day before you appt to remind you. )    Contact information:   [facility] [address], [place] [address] Phone: [phone] Fax: [phone]      Patient denies SI/HI:   Yes,  during admission, group, and self report.    Safety Planning and Suicide Prevention discussed:  Yes,  spe not required for this pt. she was provided with spi pamphlet and encouraged to share information with her support network.   Smart, HeatherLCSWA  05/11/2013, 8:39 AM

## 2013-10-17 ENCOUNTER — Emergency Department (HOSPITAL_COMMUNITY)
Admission: EM | Admit: 2013-10-17 | Discharge: 2013-10-17 | Discharge status: Against Medical Advice | Payer: Self-pay | Attending: Emergency Medicine | Admitting: Emergency Medicine

## 2013-10-17 ENCOUNTER — Encounter (HOSPITAL_COMMUNITY): Payer: Self-pay | Admitting: Emergency Medicine

## 2013-10-17 DIAGNOSIS — T401X4A Poisoning by heroin, undetermined, initial encounter: Secondary | ICD-10-CM | POA: Insufficient documentation

## 2013-10-17 DIAGNOSIS — Z79899 Other long term (current) drug therapy: Secondary | ICD-10-CM | POA: Insufficient documentation

## 2013-10-17 DIAGNOSIS — Z21 Asymptomatic human immunodeficiency virus [HIV] infection status: Secondary | ICD-10-CM | POA: Insufficient documentation

## 2013-10-17 DIAGNOSIS — M1990 Unspecified osteoarthritis, unspecified site: Secondary | ICD-10-CM | POA: Insufficient documentation

## 2013-10-17 DIAGNOSIS — T401X1A Poisoning by heroin, accidental (unintentional), initial encounter: Secondary | ICD-10-CM | POA: Insufficient documentation

## 2013-10-17 DIAGNOSIS — Y939 Activity, unspecified: Secondary | ICD-10-CM | POA: Insufficient documentation

## 2013-10-17 DIAGNOSIS — Z8619 Personal history of other infectious and parasitic diseases: Secondary | ICD-10-CM | POA: Insufficient documentation

## 2013-10-17 DIAGNOSIS — M549 Dorsalgia, unspecified: Secondary | ICD-10-CM | POA: Insufficient documentation

## 2013-10-17 DIAGNOSIS — Y929 Unspecified place or not applicable: Secondary | ICD-10-CM | POA: Insufficient documentation

## 2013-10-17 DIAGNOSIS — Z8719 Personal history of other diseases of the digestive system: Secondary | ICD-10-CM | POA: Insufficient documentation

## 2013-10-17 DIAGNOSIS — F17200 Nicotine dependence, unspecified, uncomplicated: Secondary | ICD-10-CM | POA: Insufficient documentation

## 2013-10-17 DIAGNOSIS — G40909 Epilepsy, unspecified, not intractable, without status epilepticus: Secondary | ICD-10-CM | POA: Insufficient documentation

## 2013-10-17 DIAGNOSIS — G8929 Other chronic pain: Secondary | ICD-10-CM | POA: Insufficient documentation

## 2013-10-17 LAB — I-STAT CHEM 8, ED
BUN: 5 mg/dL — ABNORMAL LOW (ref 6–23)
Calcium, Ion: 1.2 mmol/L (ref 1.12–1.23)
Chloride: 105 mEq/L (ref 96–112)
Creatinine, Ser: 0.8 mg/dL (ref 0.50–1.10)
Glucose, Bld: 107 mg/dL — ABNORMAL HIGH (ref 70–99)
HCT: 41 % (ref 36.0–46.0)
Hemoglobin: 13.9 g/dL (ref 12.0–15.0)
Potassium: 3.4 mEq/L — ABNORMAL LOW (ref 3.7–5.3)
Sodium: 147 mEq/L (ref 137–147)
TCO2: 23 mmol/L (ref 0–100)
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
## 2013-10-17 NOTE — ED Notes (Signed)
Patient repeatedly asking if the police are here for her. Patient sees police in hallway and asks "why is he looking at me".

## 2013-10-17 NOTE — ED Notes (Signed)
Per EMS: Pt from home. Pt overdosed on heroin, not sure how much. Pt denies SI. Received 4 mg Narcan prior to EMS arrival because pt was apneic with weak carotid pulses. Pt woke up about 5 minutes after receiving Narcan. Unable to obtain IV access. Initially 91% RA, put on 2 L [place], up to 96%.

## 2013-10-17 NOTE — ED Notes (Signed)
Patient was found to be sitting up in the bed, she had removed her EKG leads, was topless and attempting to remove her IV. Patient was advised she is not up for discharge. Patient requested to see her boyfriend. Boyfriend was brought to bedside where patient continued to state she needed to leave. Dr Juleen China was notified and came to bedside. Patient was explained reasons behind staying for additional evaluation. Patient boyfriend states he will stay with her, that he has nasal narcan at home. Patient signed out AMA. Patient then ambulated out of ER.

## 2013-10-17 NOTE — Discharge Instructions (Signed)
Accidental Overdose °A drug overdose occurs when a chemical substance (drug or medication) is used in amounts large enough to overcome a person. This may result in severe illness or death. This is a type of poisoning. Accidental overdoses of medications or other substances come from a variety of reasons. When this happens accidentally, it is often because the person taking the substance does not know enough about what they have taken. Drugs which commonly cause overdose deaths are alcohol, psychotropic medications (medications which affect the mind), pain medications, illegal drugs (street drugs) such as cocaine and heroin, and multiple drugs taken at the same time. It may result from careless behavior (such as over-indulging at a party). Other causes of overdose may include multiple drug use, a lapse in memory, or drug use after a period of no drug use.  °Sometimes overdosing occurs because a person cannot remember if they have taken their medication.  °A common unintentional overdose in young children involves multi-vitamins containing iron. Iron is a part of the hemoglobin molecule in blood. It is used to transport oxygen to living cells. When taken in small amounts, iron allows the body to restock hemoglobin. In large amounts, it causes problems in the body. If this overdose is not treated, it can lead to death. °Never take medicines that show signs of tampering or do not seem quite right. Never take medicines in the dark or in poor lighting. Read the label and check each dose of medicine before you take it. When adults are poisoned, it happens most often through carelessness or lack of information. Taking medicines in the dark or taking medicine prescribed for someone else to treat the same type of problem is a dangerous practice. °SYMPTOMS  °Symptoms of overdose depend on the medication and amount taken. They can vary from over-activity with stimulant over-dosage, to sleepiness from depressants such as  alcohol, narcotics and tranquilizers. Confusion, dizziness, nausea and vomiting may be present. If problems are severe enough coma and death may result. °DIAGNOSIS  °Diagnosis and management are generally straightforward if the drug is known. Otherwise it is more difficult. At times, certain symptoms and signs exhibited by the patient, or blood tests, can reveal the drug in question.  °TREATMENT  °In an emergency department, most patients can be treated with supportive measures. Antidotes may be available if there has been an overdose of opioids or benzodiazepines. A rapid improvement will often occur if this is the cause of overdose. °At home or away from medical care: °· There may be no immediate problems or warning signs in children. °· Not everything works well in all cases of poisoning. °· Take immediate action. Poisons may act quickly. °· If you think someone has swallowed medicine or a household product, and the person is unconscious, having seizures (convulsions), or is not breathing, immediately call for an ambulance. °IF a person is conscious and appears to be doing OK but has swallowed a poison: °· Do not wait to see what effect the poison will have. Immediately call a poison control center (listed in the white pages of your telephone book under "Poison Control" or inside the front cover with other emergency numbers). Some poison control centers have TTY capability for the deaf. Check with your local center if you or someone in your family requires this service. °· Keep the container so you can read the label on the product for ingredients. °· Describe what, when, and how much was taken and the age and condition of the person poisoned.   Inform them if the person is vomiting, choking, drowsy, shows a change in color or temperature of skin, is conscious or unconscious, or is convulsing.  Do not cause vomiting unless instructed by medical personnel. Do not induce vomiting or force liquids into a person who  is convulsing, unconscious, or very drowsy. Stay calm and in control.   Activated charcoal also is sometimes used in certain types of poisoning and you may wish to add a supply to your emergency medicines. It is available without a prescription. Call a poison control center before using this medication. PREVENTION  Thousands of children die every year from unintentional poisoning. This may be from household chemicals, poisoning from carbon monoxide in a car, taking their parent's medications, or simply taking a few iron pills or vitamins with iron. Poisoning comes from unexpected sources.  Store medicines out of the sight and reach of children, preferably in a locked cabinet. Do not keep medications in a food cabinet. Always store your medicines in a secure place. Get rid of expired medications.  If you have children living with you or have them as occasional guests, you should have child-resistant caps on your medicine containers. Keep everything out of reach. Child proof your home.  If you are called to the telephone or to answer the door while you are taking a medicine, take the container with you or put the medicine out of the reach of small children.  Do not take your medication in front of children. Do not tell your child how good a medication is and how good it is for them. They may get the idea it is more of a treat.  If you are an adult and have accidentally taken an overdose, you need to consider how this happened and what can be done to prevent it from happening again. If this was from a street drug or alcohol, determine if there is a problem that needs addressing. If you are not sure a problems exists, it is easy to talk to a professional and ask them if they think you have a problem. It is better to handle this problem in this way before it happens again and has a much worse consequence. Document Released: 07/19/2004 Document Revised: 07/28/2011 Document Reviewed: 12/25/2008 [facility]  Patient Information 2014 [employer] [place].   Emergency Department Resource Guide 1) Find a Doctor and Pay Out of Pocket Although you won't have to find out who is covered by your insurance plan, it is a good idea to ask around and get recommendations. You will then need to call the office and see if the doctor you have chosen will accept you as a new patient and what types of options they offer for patients who are self-pay. Some doctors offer discounts or will set up payment plans for their patients who do not have insurance, but you will need to ask so you aren't surprised when you get to your appointment.  2) Contact Your Local Health Department Not all health departments have doctors that can see patients for sick visits, but many do, so it is worth a call to see if yours does. If you don't know where your local health department is, you can check in your phone book. The CDC also has a tool to help you locate your state's health department, and many state websites also have listings of all of their local health departments.  3) Find a Walk-in Clinic If your illness is not likely to be very severe or complicated,  you may want to try a walk in clinic. These are popping up all over the country in pharmacies, drugstores, and shopping centers. They're usually staffed by nurse practitioners or physician assistants that have been trained to treat common illnesses and complaints. They're usually fairly quick and inexpensive. However, if you have serious medical issues or chronic medical problems, these are probably not your best option.  No Primary Care Doctor: - Call Health Connect at  [phone] - they can help you locate a primary care doctor that  accepts your insurance, provides certain services, etc. - Physician Referral Service- [phone]  Chronic Pain Problems: Organization         Address  Phone   Notes  [facility]  [phone] Patients need to be referred by their  primary care doctor.   Medication Assistance: Organization         Address  Phone   Notes  [facility] Medication [facility] [address] [place] [place], [place] [address] [phone] --Must be a resident of [facility] -- Must have NO insurance coverage whatsoever (no Medicaid/ Medicare, etc.) -- The pt. MUST have a primary care doctor that directs their care regularly and follows them in the community   [facility]  [phone]   [employer]  [phone]    Agencies that provide inexpensive medical care: Organization         Address  Phone   Notes  [facility]  [phone]   [facility]    [phone]   [facility] [address] [place], [place] [address] [phone]   [facility] [address], [place] [phone]   Planned Parenthood    [phone]   [facility]    [phone]   [facility]  [address], [place] Phone:  [phone], Fax:  [phone] Hours of Operation:  9 am - 6 pm, M-F.  Also accepts Medicaid/Medicare and self-pay.  [facility]  [address], [place] Phone: [phone], Fax: [phone]. Hours of Operation:  8:30 am - 5:30 pm, M-F.  Also accepts Medicaid and self-pay.  [facility] [place] [address], [place] Phone: [phone]   [facility] [address] Natasha Bence [place], [place] [phone], Ext. [phone] Mondays & Thursdays: 7-9 AM.  First 15 patients are seen on a first come, first serve basis.    Medicaid-accepting [facility] Providers:  Organization         Address  Phone   Notes  [facility] [address],  [phone] Also accepts self-pay patients.  [facility] [address] Laurell Josephs [place], [place]  [phone]   [facility] [address], [place] [phone]   [facility] [address] [place] [phone]   Renaye Rakers [address], [place]   [phone] Only accepts [place] Access [place] patients after they have their name applied to their card.   Self-Pay (no insurance) in [facility]:  Organization         Address  Phone   Notes  Sickle Cell Patients, [facility] [address] [place], [place] [phone]   [facility] [address] [place],   [phone]   [facility] [place]  [address], [place] [phone]   [facility]/Dr. Osei-Bonsu  [address] [place] or [address], [place] [phone] Phone number for both [place] and [place] locations is the same.  [facility] [address] [place] [phone]   [facility] [address] [place] or [address] [phone]   [facility] [address] [place] [phone], phone; [phone], fax Sees patients 1st and 3rd Saturday of every month.  Must not qualify for public or private insurance (i.e. Medicaid, Medicare, [place] Health Choice, Veterans' Benefits)  Household income should be no more than 200% of the poverty level The clinic cannot treat you if you are pregnant or think you are pregnant  Sexually transmitted diseases are not treated at the clinic.    Dental Care: Organization         Address  Phone  Notes  [facility] Department of [facility] [address] [place], [place] [phone] Accepts children up to age 18 who are enrolled in [place] or [place] Health Choice; pregnant women with a Medicaid card; and children who have applied for Medicaid or [place] Health Choice, but were declined, whose parents can pay a reduced fee  at time of service.  [facility] Department of [facility]  [address], [place] [phone] Accepts children up to age 2 who are enrolled in [place] or [place] Health Choice; pregnant women with a Medicaid card; and children who have applied for Medicaid or [place] Health Choice, but were declined, whose parents can pay a reduced fee at time of service.  [facility]  [address] [place], [place] [phone] Patients are seen by appointment only. Walk-ins are not accepted. [facility] will see patients 99 years of age and older. Monday - Tuesday (8am-5pm) Most Wednesdays (8:30-5pm) $30 per visit, cash only  [facility] Adult Dental Access PROGRAM  [address], [facility] [phone] Patients are seen by appointment only. Walk-ins are not accepted. [facility] will see patients 7 years of age and older. One Wednesday Evening (Monthly: Volunteer Based).  $30 per visit, cash only  [employer]  [phone] for adults; Children under age 29, call Graduate Pediatric Dentistry at [phone]. Children aged 24-14, please call [phone] to request a pediatric application.  Dental services are provided in all areas of dental care including fillings, crowns and bridges, complete and partial dentures, implants, gum treatment, root canals, and extractions. Preventive care is also provided. Treatment is provided to both adults and children. Patients are selected via a lottery and there is often a waiting list.   [facility] [address] [place]  [phone] [email or web address]   [facility] [address] [place], [place] [phone], Ext. [phone] Second and Fourth Thursday of each month, opens at 6:30 AM; Clinic ends at 9 AM.  Patients are seen on a first-come first-served basis, and a limited number are seen during each clinic.   [facility]  [address] Ether Griffins [place], [place] [phone]   Eligibility Requirements You must have lived in [place], [place], or [place] counties for at least the last three months.   You cannot be eligible  for state or [employer], including [employer], [place], or [employer].   You generally cannot be eligible for healthcare insurance through your employer.    How to apply: Eligibility screenings are held every Tuesday and Wednesday afternoon from 1:00 pm until 4:00 pm. You do not need an appointment for the interview!  [facility] [address], [place], [place] [phone]   [facility] Health Department  [phone]   [facility] Health Department  [phone]   [facility] Health Department  [phone]    Behavioral Health Resources in the Community: Intensive Outpatient Programs Organization         Address  Phone  Notes  [facility] [address], [place], [place] [phone]   [facility] Outpatient [address], [place], [place] [phone]   [facility] [address], [place], [place]  [phone]   [facility] [address],  [place], [place] [phone] or [phone]   Substance Abuse Resources Organization         Address  Phone  Notes  [facility] Drug Services  [phone]   [facility]  [phone]   [facility]  [phone]   Floydene Flock  [phone]   Residential & Outpatient Substance Abuse Program  [phone]   Psychological Services Organization         Address  Phone  Notes  [facility] Behavioral Health  [phone]   [facility]  [phone]   [facility] [address] [place] [phone] or [phone]    Mobile Crisis Teams Organization         Address  Phone  Notes  [facility], Mobile Crisis Care Unit  [phone]   Assertive Psychotherapeutic  Services  [address] [place], [place] [phone]   Doristine Locks [address] [place] [phone]    Self-Help/Support Groups Organization         Address  Phone             Notes  [facility] [place] - variety of support groups  336- I7437963 Call for more information  Narcotics Anonymous (NA), [facility] [address], [employer] [place]  2 meetings at this location   Statistician         Address  Phone  Notes  [facility] [address],    [place]  [phone]   [facility]  [address], [place] [address], [place], [place] [phone]   [facility] [address] [place], [place] [phone] Admissions: 8am-3pm M-F  [facility] [address],    [place], [place] [phone]   [facility] [address] Starling Manns [place], [place] [phone]   [facility] [address],  [place], [place] [phone]   [facility] - Intensive Outpatient [address] Laurell Josephs [address], [place], [place] [phone]   [facility] ([facility] [address] [place]  [place], [place] [phone] or [phone]   [facility] ([facility]) [address], [place], [place] [phone] Accepts Medicaid  [facility] [place] [address],  [place] [phone] Substance Abuse/Addiction Treatment   [facility] Organization         Address  Phone  Notes  [facility]  [phone]   Angie Fava, PhD [address] [place], [place]   [phone]  [phone] [phone] or [phone]) [phone]   [facility]   [address] [place], [place] [phone]   [facility] [address], [place], [place] [phone] Insurance/Medicaid/sponsorship through [facility] Families [address]                                    [place], [place] [phone] Therapy/tele-psych/case  [facility] [address]   [place], [place] [phone]    Dr. Lolly Mustache  [phone]   [facility]  [employer] [facility] Dept. 1) [address], [place] 2) [address], [place] 3)  [address], [place] [phone]  [phone]   [facility] Child Abuse Hotline 3107224152 or [phone] (After Hours)

## 2013-10-17 NOTE — ED Provider Notes (Signed)
CSN: [id number]     Arrival date & time 10/17/13  2040 History   First MD Initiated Contact with Patient 10/17/13 2048     Chief Complaint  Patient presents with  . Drug Overdose     (Consider location/radiation/quality/duration/timing/severity/associated sxs/prior Treatment) HPI  38 year old female with a heroin overdose. Patient has chronic pain. She is out of her opiates she took heroin instead. She became unresponsive. On EMS arrival patient was apneic and had weak pulses. She is given 4 mg of Narcan with a brisk clinical response. Patient denies any other ingestion. She denies trying to hurt herself. She has no somatic complaints aside from her chronic pain.  Past Medical History  Diagnosis Date  . Seizures   . Back ache   . HIV (human immunodeficiency virus infection)   . Hepatitis   . DDD (degenerative disc disease)   . Pancreatitis    Past Surgical History  Procedure Laterality Date  . Back surgery     Family History  Problem Relation Age of Onset  . Hypertension Father   . Hypertension Paternal Grandmother   . Hypertension Paternal Grandfather    History  Substance Use Topics  . Smoking status: Current Every Day Smoker -- 1.00 packs/day    Types: Cigarettes  . Smokeless tobacco: Never Used  . Alcohol Use: Yes     Comment: occasional   OB History   Grav Para Term Preterm Abortions TAB SAB Ect Mult Living                 Review of Systems  All systems reviewed and negative, other than as noted in HPI.   Allergies  Sulfa antibiotics  Home Medications   Prior to Admission medications   Medication Sig Start Date End Date Taking? Authorizing Provider  atenolol (TENORMIN) 25 MG tablet Take 1 tablet (25 mg total) by mouth daily. 05/07/13   Nanine MeansJamison Lord, NP  chlordiazePOXIDE (LIBRIUM) 25 MG capsule Take 1 capsule (25 mg total) by mouth daily. 05/08/13   Nanine MeansJamison Lord, NP  docusate sodium 100 MG CAPS Take 100 mg by mouth daily. 05/07/13   Nanine MeansJamison Lord, NP   gabapentin (NEURONTIN) 300 MG capsule Take 1 capsule (300 mg total) by mouth 3 (three) times daily. 05/07/13   Nanine MeansJamison Lord, NP  ketorolac (TORADOL) 10 MG tablet Take 1 tablet (10 mg total) by mouth every 6 (six) hours. 05/07/13   Nanine MeansJamison Lord, NP  lidocaine (LIDODERM) 5 % Place 1 patch onto the skin daily. Remove & Discard patch within 12 hours or as directed by MD 05/07/13   Nanine MeansJamison Lord, NP  loratadine (CLARITIN) 10 MG tablet Take 1 tablet (10 mg total) by mouth daily. 05/07/13   Nanine MeansJamison Lord, NP  methocarbamol (ROBAXIN) 750 MG tablet Take 1 tablet (750 mg total) by mouth every 6 (six) hours as needed for muscle spasms. 05/07/13   Nanine MeansJamison Lord, NP  QUEtiapine (SEROQUEL) 50 MG tablet Take 1 tablet (50 mg total) by mouth at bedtime as needed and may repeat dose one time if needed (sleep). 05/07/13   Nanine MeansJamison Lord, NP  traZODone (DESYREL) 50 MG tablet Take 1 tablet (50 mg total) by mouth at bedtime and may repeat dose one time if needed. 05/07/13   Nanine MeansJamison Lord, NP   BP 133/98  Pulse 102  Temp(Src) 98.1 F (36.7 C) (Oral)  Resp 12  SpO2 96% Physical Exam  Nursing note and vitals reviewed. Constitutional: She appears well-developed and well-nourished. No distress.  No external signs  of acute trauma.  HENT:  Head: Normocephalic and atraumatic.  Eyes: Conjunctivae are normal. Right eye exhibits no discharge. Left eye exhibits no discharge.  Neck: Neck supple.  No nuchal rigidity.  Cardiovascular: Normal rate, regular rhythm and normal heart sounds.  Exam reveals no gallop and no friction rub.   No murmur heard. Pulmonary/Chest: Effort normal and breath sounds normal. No respiratory distress.  Abdominal: Soft. She exhibits no distension. There is no tenderness.  Musculoskeletal: She exhibits no edema and no tenderness.  Neurological:  Patient is awake, but somewhat drowsy. Answering questions appropriately and following commands. Cranial nerves are intact. Strength is 5 out of 5 bilateral  upper lower extremities.  Skin: Skin is warm and dry.  Psychiatric: She has a normal mood and affect. Her behavior is normal. Thought content normal.    ED Course  Procedures (including critical care time)  Angiocath insertion Performed by: Raeford Razor  Consent: Verbal consent obtained. Risks and benefits: risks, benefits and alternatives were discussed Time out: Immediately prior to procedure a "time out" was called to verify the correct patient, procedure, equipment, support staff and site/side marked as required.  Preparation: Patient was prepped and draped in the usual sterile fashion.  Vein Location: L IJ   Gauge: 20g  Normal blood return and flush without difficulty Patient tolerance: Patient tolerated the procedure well with no immediate complications.    Labs Review Labs Reviewed  I-STAT CHEM 8, ED - Abnormal; Notable for the following:    Potassium 3.4 (*)    BUN 5 (*)    Glucose, Bld 107 (*)    All other components within normal limits    Imaging Review No results found.   EKG Interpretation   Date/Time:  Monday Nov 04, 2013 20:42:29 EDT Ventricular Rate:  95 PR Interval:  145 QRS Duration: 75 QT Interval:  352 QTC Calculation: 442 R Axis:   45 Text Interpretation:  Sinus rhythm Normal ECG No old tracing to compare  Confirmed by Lien Lyman  MD, Macrae Wiegman ([id number]) on 11-04-2013 9:42:02 PM      MDM   Final diagnoses:  Accidental heroin overdose   59:79 PM  38 year old female with unintentional heroin overdose. Currently awake and appropriate. HD stable. IV established. Given Narcan shortly before arrival. Will continue to observe.  9:39 PM Patient remains awake. Texting on her cell phone.  10:00 PM Patient refusing to stay. She has medical decision-making capability. Understands that half life of narcan is shorter than many drugs of abuse and that I feel she needs to be observed longer. She understands risk of this including, but not limited to  decreasing mental status, respiratory depression, anoxic brain injury, death.   Raeford Razor, MD 11/04/2013 2203

## 2013-10-17 NOTE — ED Notes (Signed)
Bed: RESA Expected date:  Expected time:  Means of arrival:  Comments: EMS heroin overdose 

## 2013-11-17 ENCOUNTER — Emergency Department (HOSPITAL_COMMUNITY)
Admission: EM | Admit: 2013-11-17 | Discharge: 2013-11-17 | Disposition: A | Discharge status: Home / Self Care | Payer: Self-pay | Attending: Emergency Medicine | Admitting: Emergency Medicine

## 2013-11-17 ENCOUNTER — Emergency Department (HOSPITAL_COMMUNITY): Payer: Self-pay

## 2013-11-17 DIAGNOSIS — E871 Hypo-osmolality and hyponatremia: Secondary | ICD-10-CM | POA: Insufficient documentation

## 2013-11-17 DIAGNOSIS — R112 Nausea with vomiting, unspecified: Secondary | ICD-10-CM | POA: Insufficient documentation

## 2013-11-17 DIAGNOSIS — G8929 Other chronic pain: Secondary | ICD-10-CM | POA: Insufficient documentation

## 2013-11-17 DIAGNOSIS — Z8619 Personal history of other infectious and parasitic diseases: Secondary | ICD-10-CM | POA: Insufficient documentation

## 2013-11-17 DIAGNOSIS — Z8739 Personal history of other diseases of the musculoskeletal system and connective tissue: Secondary | ICD-10-CM | POA: Insufficient documentation

## 2013-11-17 DIAGNOSIS — R000 Tachycardia, unspecified: Secondary | ICD-10-CM | POA: Insufficient documentation

## 2013-11-17 DIAGNOSIS — Z79899 Other long term (current) drug therapy: Secondary | ICD-10-CM | POA: Insufficient documentation

## 2013-11-17 DIAGNOSIS — N390 Urinary tract infection, site not specified: Secondary | ICD-10-CM | POA: Insufficient documentation

## 2013-11-17 DIAGNOSIS — Z21 Asymptomatic human immunodeficiency virus [HIV] infection status: Secondary | ICD-10-CM | POA: Insufficient documentation

## 2013-11-17 DIAGNOSIS — Z3202 Encounter for pregnancy test, result negative: Secondary | ICD-10-CM | POA: Insufficient documentation

## 2013-11-17 DIAGNOSIS — F17200 Nicotine dependence, unspecified, uncomplicated: Secondary | ICD-10-CM | POA: Insufficient documentation

## 2013-11-17 LAB — RAPID URINE DRUG SCREEN, HOSP PERFORMED
Amphetamines: NOT DETECTED
BENZODIAZEPINES: POSITIVE — AB
Barbiturates: NOT DETECTED
Cocaine: POSITIVE — AB
OPIATES: POSITIVE — AB
Tetrahydrocannabinol: NOT DETECTED

## 2013-11-17 LAB — COMPREHENSIVE METABOLIC PANEL
ALT: 23 U/L (ref 0–35)
AST: 57 U/L — ABNORMAL HIGH (ref 0–37)
Albumin: 4 g/dL (ref 3.5–5.2)
Alkaline Phosphatase: 201 U/L — ABNORMAL HIGH (ref 39–117)
Anion gap: 14 (ref 5–15)
BUN: 7 mg/dL (ref 6–23)
CALCIUM: 8.9 mg/dL (ref 8.4–10.5)
CO2: 27 meq/L (ref 19–32)
CREATININE: 0.47 mg/dL — AB (ref 0.50–1.10)
Chloride: 85 mEq/L — ABNORMAL LOW (ref 96–112)
GFR calc Af Amer: >90 mL/min (ref >90)
Glucose, Bld: 111 mg/dL — ABNORMAL HIGH (ref 70–99)
Potassium: 3.7 mEq/L (ref 3.7–5.3)
Sodium: 126 mEq/L — ABNORMAL LOW (ref 137–147)
Total Bilirubin: 0.7 mg/dL (ref 0.3–1.2)
Total Protein: 7.8 g/dL (ref 6.0–8.3)

## 2013-11-17 LAB — URINALYSIS, ROUTINE W REFLEX MICROSCOPIC
BILIRUBIN URINE: NEGATIVE
GLUCOSE, UA: NEGATIVE mg/dL
Ketones, ur: NEGATIVE mg/dL
Nitrite: POSITIVE — AB
PH: 5.5 (ref 5.0–8.0)
Protein, ur: NEGATIVE mg/dL
SPECIFIC GRAVITY, URINE: 1.007 (ref 1.005–1.030)
Urobilinogen, UA: 0.2 mg/dL (ref 0.0–1.0)

## 2013-11-17 LAB — CBC
HCT: 33.4 % — ABNORMAL LOW (ref 36.0–46.0)
HEMOGLOBIN: 11.7 g/dL — AB (ref 12.0–15.0)
MCH: 33.8 pg (ref 26.0–34.0)
MCHC: 35 g/dL (ref 30.0–36.0)
MCV: 96.5 fL (ref 78.0–100.0)
Platelets: 166 10*3/uL (ref 150–400)
RBC: 3.46 MIL/uL — AB (ref 3.87–5.11)
RDW: 13.2 % (ref 11.5–15.5)
WBC: 7.8 10*3/uL (ref 4.0–10.5)

## 2013-11-17 LAB — URINE MICROSCOPIC-ADD ON

## 2013-11-17 LAB — LIPASE, BLOOD: LIPASE: 42 U/L (ref 11–59)

## 2013-11-17 LAB — TYPE AND SCREEN
ABO/RH(D): A POS
ANTIBODY SCREEN: NEGATIVE

## 2013-11-17 LAB — POC URINE PREG, ED: PREG TEST UR: NEGATIVE

## 2013-11-17 LAB — PREGNANCY, URINE: Preg Test, Ur: NEGATIVE

## 2013-11-17 LAB — ETHANOL

## 2013-11-17 LAB — ABO/RH: ABO/RH(D): A POS

## 2013-11-17 MED ORDER — CEPHALEXIN 500 MG PO CAPS
500.0000 mg | ORAL_CAPSULE | Freq: Two times a day (BID) | ORAL | Status: DC
Start: 2013-11-17 — End: 2014-12-31

## 2013-11-17 MED ORDER — LORAZEPAM 2 MG/ML IJ SOLN: 1.0000 mg | Freq: Four times a day (QID) | INTRAMUSCULAR | Status: DC | PRN

## 2013-11-17 MED ORDER — ONDANSETRON 4 MG PO TBDP
4.0000 mg | ORAL_TABLET | Freq: Once | ORAL | Status: AC
  Administered 2013-11-17: 4 mg via ORAL
  Filled 2013-11-17: qty 1

## 2013-11-17 MED ORDER — HYDROMORPHONE HCL PF 1 MG/ML IJ SOLN
1.0000 mg | Freq: Once | INTRAMUSCULAR | Status: AC
  Administered 2013-11-17: 1 mg via INTRAMUSCULAR
  Filled 2013-11-17: qty 1

## 2013-11-17 MED ORDER — SODIUM CHLORIDE 0.9 % IV BOLUS (SEPSIS): 1000.0000 mL | Freq: Once | INTRAVENOUS | Status: DC

## 2013-11-17 MED ORDER — LORAZEPAM 1 MG PO TABS: 0.0000 mg | ORAL_TABLET | Freq: Two times a day (BID) | ORAL | Status: DC

## 2013-11-17 MED ORDER — CEFTRIAXONE SODIUM 1 G IJ SOLR
1.0000 g | Freq: Once | INTRAMUSCULAR | Status: AC
  Administered 2013-11-17: 1 g via INTRAMUSCULAR
  Filled 2013-11-17: qty 10

## 2013-11-17 MED ORDER — ONDANSETRON HCL 4 MG/2ML IJ SOLN: 4.0000 mg | Freq: Once | INTRAMUSCULAR | Status: DC

## 2013-11-17 MED ORDER — LIDOCAINE HCL 1 % IJ SOLN
INTRAMUSCULAR | Status: AC
  Administered 2013-11-17: 20 mL via INTRAMUSCULAR
  Filled 2013-11-17: qty 20

## 2013-11-17 MED ORDER — LORAZEPAM 1 MG PO TABS
0.0000 mg | ORAL_TABLET | Freq: Four times a day (QID) | ORAL | Status: DC
  Administered 2013-11-17: 1 mg via ORAL
  Filled 2013-11-17: qty 1

## 2013-11-17 MED ORDER — LORAZEPAM 1 MG PO TABS: 1.0000 mg | ORAL_TABLET | Freq: Four times a day (QID) | ORAL | Status: DC | PRN

## 2013-11-17 NOTE — ED Notes (Signed)
Patient on thephone with someone telling them that the hospital would not give her any pain medicine and to have her stuff ready on the dresser when she got home. Patient had friend pushing her out in the hallway. Patient asked to go back to her room. Patient then wanted her friend to take her outside. Patient accompanied by Clydie BraunKaren, RN/ED director.

## 2013-11-17 NOTE — ED Notes (Addendum)
Pt given ice water to encouraged fluid intake.  

## 2013-11-17 NOTE — ED Provider Notes (Signed)
Medical screening examination/treatment/procedure(s) were performed by non-physician practitioner and as supervising physician I was immediately available for consultation/collaboration.   EKG Interpretation   Date/Time:  Thursday November 17 2013 05:52:53 EDT Ventricular Rate:  121 PR Interval:  149 QRS Duration: 70 QT Interval:  314 QTC Calculation: 445 R Axis:   61 Text Interpretation:  Sinus tachycardia Low voltage, precordial leads No  acute findings Confirmed by Rhunette CroftNANAVATI, MD, Daelen Belvedere ([id number]) on 11/17/2013  5:57:13 AM       Derwood KaplanAnkit Kaula Klenke, MD 11/17/13 2308

## 2013-11-17 NOTE — ED Notes (Signed)
Patient given a sandwich and sprite.

## 2013-11-17 NOTE — ED Notes (Signed)
Patient jumped from 3 floor balcony last week and broke her right leg. She has not been using her crutches, instead she has been crawling around on knees and elbows. She complains of elbow and knee pain. She states she has been vomiting blood since jumping off of the balcony last week. She states she is out of dilaudid and oxycodone so she injected heroin today and drank alcohol. She is clearly under the influence at this time.

## 2013-11-17 NOTE — ED Provider Notes (Signed)
CSN: [id number]     Arrival date & time 11/17/13  0456 History   First MD Initiated Contact with Patient 11/17/13 0602     Chief Complaint  Patient presents with  . Elbow Pain  . Leg Pain     (Consider location/radiation/quality/duration/timing/severity/associated sxs/prior Treatment) The history is provided by the patient.    Patient reports she broke her right lower leg or ankle last week after jumping off a third floor balcony trying to kill herself.  Pt states that because her left ankle also hurts she has not been able to use her crutches or the walker her friend provided.  This is in part because she has not been able to afford her chronic pain medications.  She has been crawling around on her floor to and from places and has not been able to eat or drink much because of this.  She also has not been to her PCP for prescription refills of her oxycodone and xanax, so she has been drinking alcohol daily (a few 24 oz beers/day) and used heroin this morning for the pain.  Has chronic pancreatitis and has had abdominal pain since she ran out of her chronic pain medication last week.  Has been nauseated from the pain and has been vomiting blood since yesterday.  Recently had temperature of 102 and has had dysuria.    She is currently intoxicated and somewhat difficult to keep on topic during our conversation.  Denies SI currently.    Past Medical History  Diagnosis Date  . Seizures   . Back ache   . HIV (human immunodeficiency virus infection)   . Hepatitis   . DDD (degenerative disc disease)   . Pancreatitis    Past Surgical History  Procedure Laterality Date  . Back surgery     Family History  Problem Relation Age of Onset  . Hypertension Father   . Hypertension Paternal Grandmother   . Hypertension Paternal Grandfather    History  Substance Use Topics  . Smoking status: Current Every Day Smoker -- 1.00 packs/day    Types: Cigarettes  . Smokeless tobacco: Never Used  .  Alcohol Use: Yes     Comment: occasional   OB History   Grav Para Term Preterm Abortions TAB SAB Ect Mult Living                 Review of Systems  All other systems reviewed and are negative.     Allergies  Sulfa antibiotics  Home Medications   Prior to Admission medications   Medication Sig Start Date End Date Taking? Authorizing Provider  alprazolam Prudy Feeler(XANAX) 2 MG tablet Take 2 mg by mouth 4 (four) times daily as needed for sleep.    Historical Provider, MD  Multiple Vitamin (MULTIVITAMIN WITH MINERALS) TABS tablet Take 1 tablet by mouth daily.    Historical Provider, MD  naloxone [employer](NARCAN) 0.4 MG/ML injection Inject 0.4 mg into the vein once.    Historical Provider, MD  OVER THE COUNTER MEDICATION Take 1 tablet by mouth daily. Hydroxycut.    Historical Provider, MD  oxycodone (ROXICODONE) 30 MG immediate release tablet Take 30 mg by mouth every 4 (four) hours as needed for pain.    Historical Provider, MD  [facility] PO Take 2 tablets by mouth daily.    Historical Provider, MD  traZODone (DESYREL) 50 MG tablet Take 50 mg by mouth at bedtime and may repeat dose one time if needed. Sleep. 05/07/13   Catha NottinghamJamison  Lord, NP   BP 147/96  Temp(Src) 97.5 F (36.4 C) (Oral)  Resp 20  SpO2 100%  LMP 11/13/2013 Physical Exam  Nursing note and vitals reviewed. Constitutional: She appears well-developed and well-nourished. No distress.  Pt awake and conversant but groggy - eyes are open but heavy lidded.  She does not fall asleep during conversation.    HENT:  Head: Normocephalic and atraumatic.  Neck: Neck supple.  Cardiovascular: Regular rhythm.  Tachycardia present.   Pulmonary/Chest: Effort normal and breath sounds normal. No respiratory distress. She has no wheezes. She has no rales.  Abdominal: Soft. She exhibits no distension. There is tenderness. There is no rebound and no guarding.  Musculoskeletal:       Left knee: She exhibits normal range of motion, no swelling, no  effusion, no ecchymosis, no deformity, normal alignment, no LCL laxity and no MCL laxity. No tenderness found.       Left ankle: Normal.       Legs: Cast on right lower leg.   Neurological: She is alert.  Skin: She is not diaphoretic.    ED Course  Procedures (including critical care time) Labs Review Labs Reviewed  URINE RAPID DRUG SCREEN (HOSP PERFORMED) - Abnormal; Notable for the following:    Opiates POSITIVE (*)    Cocaine POSITIVE (*)    Benzodiazepines POSITIVE (*)    All other components within normal limits  URINALYSIS, ROUTINE W REFLEX MICROSCOPIC - Abnormal; Notable for the following:    APPearance TURBID (*)    Hgb urine dipstick MODERATE (*)    Nitrite POSITIVE (*)    Leukocytes, UA LARGE (*)    All other components within normal limits  URINE MICROSCOPIC-ADD ON - Abnormal; Notable for the following:    Squamous Epithelial / LPF MANY (*)    Bacteria, UA MANY (*)    All other components within normal limits  CBC - Abnormal; Notable for the following:    RBC 3.46 (*)    Hemoglobin 11.7 (*)    HCT 33.4 (*)    All other components within normal limits  COMPREHENSIVE METABOLIC PANEL - Abnormal; Notable for the following:    Sodium 126 (*)    Chloride 85 (*)    Glucose, Bld 111 (*)    Creatinine, Ser 0.47 (*)    AST 57 (*)    Alkaline Phosphatase 201 (*)    All other components within normal limits  URINE CULTURE  PREGNANCY, URINE  ETHANOL  LIPASE, BLOOD  POC URINE PREG, ED  TYPE AND SCREEN  ABO/RH    Imaging Review Dg Ankle Complete Left  11/17/2013   CLINICAL DATA:  Leg pain after fall  EXAM: LEFT ANKLE COMPLETE - 3+ VIEW  COMPARISON:  None.  FINDINGS: There is no evidence of fracture, dislocation, or joint effusion. No joint narrowing.  IMPRESSION: Negative.   Electronically Signed   By: Tiburcio Pea M.D.   On: 11/17/2013 06:57     EKG Interpretation   Date/Time:  Thursday November 17 2013 05:52:53 EDT Ventricular Rate:  121 PR Interval:  149 QRS  Duration: 70 QT Interval:  314 QTC Calculation: 445 R Axis:   61 Text Interpretation:  Sinus tachycardia Low voltage, precordial leads No  acute findings Confirmed by Rhunette Croft, MD, Janey Genta [phone]) on 11/17/2013  5:57:13 AM      11:18 AM Discussed pt and plan with Dr Denton Lank.  Pt is hyponatremic and clinically dehydrated but we have been unable to obtain IV.  Pt states she is only nauseated because she has been in pain.  No vomiting in ED (pt used heroin before coming to the ED for pain control).  Plan is for PO food and fluids, IM administration of pain medication.  If tolerating PO without V/D may d/c home.    Filed Vitals:   11/17/13 1134  BP: 116/87  Pulse: 115  Temp: 98.2 F (36.8 C)  Resp: 18     MDM   Final diagnoses:  Chronic pain  Nausea and vomiting, vomiting of unspecified type  UTI (lower urinary tract infection)  Hyponatremia    Pt with right foot/ankle fracture and chronic pain, has been out of pain medications at home and has had N/V secondary to uncontrolled pain.  Also found to have UTI.  Pt used heroin this morning and I therefore did not give her pain medications initially upon arrival despite her request as she was groggy. She became more lucid during her stay, did not have any further vomiting.  Tachycardia was sinus tach likely from combination of pain, narcotic/benzo withdrawal, and dehydration.  We were unable to place an IV but pt given IV fluids and food, which patient tolerated well.  Pt advised to increase PO fluids for hydration and to follow closely with PCP for recheck.  Pt given ativan (per CIWA scale) and IM dilaudid here - likely part of her N/V was some element of withdrawal from her chronic narcotic and benzo use. D/C home with keflex for UTI.  Culture pending.  Discussed result, findings, treatment, and follow up  with patient.  Pt given return precautions.  Pt verbalizes understanding and agrees with plan.           Trixie Dredgemily Alizaya Oshea, PA-C 11/17/13 1326

## 2013-11-17 NOTE — Discharge Instructions (Signed)
Read the information below.  Use the prescribed medication as directed.  Please discuss all new medications with your pharmacist.  You may return to the Emergency Department at any time for worsening condition or any new symptoms that concern you.  If you develop high fevers, worsening abdominal pain, uncontrolled vomiting, or are unable to tolerate fluids by mouth, return to the ER for a recheck.    It is very important that you drink plenty of fluids to hydrate yourself and follow up with your primary care provider this week to schedule a close appointment to be rechecked.  Take all of you antibiotics until they are gone.  Follow up with your primary care provider and your orthopedist for your pain medications. For your safety, please take only medications that are prescribed to you and take them as directed.     Chronic Pain Chronic pain can be defined as pain that is off and on and lasts for 3-6 months or longer. Many things cause chronic pain, which can make it difficult to make a diagnosis. There are many treatment options available for chronic pain. However, finding a treatment that works well for you may require trying various approaches until the right one is found. Many people benefit from a combination of two or more types of treatment to control their pain. SYMPTOMS  Chronic pain can occur anywhere in the body and can range from mild to very severe. Some types of chronic pain include:  Headache.  Low back pain.  Cancer pain.  Arthritis pain.  Neurogenic pain. This is pain resulting from damage to nerves. People with chronic pain may also have other symptoms such as:  Depression.  Anger.  Insomnia.  Anxiety. DIAGNOSIS  Your health care provider will help diagnose your condition over time. In many cases, the initial focus will be on excluding possible conditions that could be causing the pain. Depending on your symptoms, your health care provider may order tests to diagnose  your condition. Some of these tests may include:   Blood tests.   CT scan.   MRI.   X-rays.   Ultrasounds.   Nerve conduction studies.  You may need to see a specialist.  TREATMENT  Finding treatment that works well may take time. You may be referred to a pain specialist. He or she may prescribe medicine or therapies, such as:   Mindful meditation or yoga.  Shots (injections) of numbing or pain-relieving medicines into the spine or area of pain.  Local electrical stimulation.  Acupuncture.   Massage therapy.   Aroma, color, light, or sound therapy.   Biofeedback.   Working with a physical therapist to keep from getting stiff.   Regular, gentle exercise.   Cognitive or behavioral therapy.   Group support.  Sometimes, surgery may be recommended.  HOME CARE INSTRUCTIONS   Take all medicines as directed by your health care provider.   Lessen stress in your life by relaxing and doing things such as listening to calming music.   Exercise or be active as directed by your health care provider.   Eat a healthy diet and include things such as vegetables, fruits, fish, and lean meats in your diet.   Keep all follow-up appointments with your health care provider.   Attend a support group with others suffering from chronic pain. SEEK MEDICAL CARE IF:   Your pain gets worse.   You develop a new pain that was not there before.   You cannot tolerate medicines given  to you by your health care provider.   You have new symptoms since your last visit with your health care provider.  SEEK IMMEDIATE MEDICAL CARE IF:   You feel weak.   You have decreased sensation or numbness.   You lose control of bowel or bladder function.   Your pain suddenly gets much worse.   You develop shaking.  You develop chills.  You develop confusion.  You develop chest pain.  You develop shortness of breath.  MAKE SURE YOU:  Understand these  instructions.  Will watch your condition.  Will get help right away if you are not doing well or get worse. Document Released: 01/25/2002 Document Revised: 01/05/2013 Document Reviewed: 10/29/2012 [facility] Patient Information 2015 [place] [place]. This information is not intended to replace advice given to you by your health care provider. Make sure you discuss any questions you have with your health care provider.  Urinary Tract Infection Urinary tract infections (UTIs) can develop anywhere along your urinary tract. Your urinary tract is your body's drainage system for removing wastes and extra water. Your urinary tract includes two kidneys, two ureters, a bladder, and a urethra. Your kidneys are a pair of bean-shaped organs. Each kidney is about the size of your fist. They are located below your ribs, one on each side of your spine. CAUSES Infections are caused by microbes, which are microscopic organisms, including fungi, viruses, and bacteria. These organisms are so small that they can only be seen through a microscope. Bacteria are the microbes that most commonly cause UTIs. SYMPTOMS  Symptoms of UTIs may vary by age and gender of the patient and by the location of the infection. Symptoms in young women typically include a frequent and intense urge to urinate and a painful, burning feeling in the bladder or urethra during urination. Older women and men are more likely to be tired, shaky, and weak and have muscle aches and abdominal pain. A fever may mean the infection is in your kidneys. Other symptoms of a kidney infection include pain in your back or sides below the ribs, nausea, and vomiting. DIAGNOSIS To diagnose a UTI, your caregiver will ask you about your symptoms. Your caregiver also will ask to provide a urine sample. The urine sample will be tested for bacteria and white blood cells. White blood cells are made by your body to help fight infection. TREATMENT  Typically, UTIs can be  treated with medication. Because most UTIs are caused by a bacterial infection, they usually can be treated with the use of antibiotics. The choice of antibiotic and length of treatment depend on your symptoms and the type of bacteria causing your infection. HOME CARE INSTRUCTIONS  If you were prescribed antibiotics, take them exactly as your caregiver instructs you. Finish the medication even if you feel better after you have only taken some of the medication.  Drink enough water and fluids to keep your urine clear or pale yellow.  Avoid caffeine, tea, and carbonated beverages. They tend to irritate your bladder.  Empty your bladder often. Avoid holding urine for long periods of time.  Empty your bladder before and after sexual intercourse.  After a bowel movement, women should cleanse from front to back. Use each tissue only once. SEEK MEDICAL CARE IF:   You have back pain.  You develop a fever.  Your symptoms do not begin to resolve within 3 days. SEEK IMMEDIATE MEDICAL CARE IF:   You have severe back pain or lower abdominal pain.  You develop chills.  You have nausea or vomiting.  You have continued burning or discomfort with urination. MAKE SURE YOU:   Understand these instructions.  Will watch your condition.  Will get help right away if you are not doing well or get worse. Document Released: 02/12/2005 Document Revised: 11/04/2011 Document Reviewed: 06/13/2011 [facility] Patient Information 2015 [place], [place]. This information is not intended to replace advice given to you by your health care provider. Make sure you discuss any questions you have with your health care provider.

## 2013-11-17 NOTE — ED Notes (Signed)
Bed: WA09 Expected date:  Expected time:  Means of arrival:  Comments: EMS leg and bila elbow pain

## 2013-11-17 NOTE — Progress Notes (Signed)
[facility] provided pt with a [facility] Orange Card application, highlighting [facility].  °

## 2013-11-19 LAB — URINE CULTURE

## 2013-11-20 ENCOUNTER — Telehealth (HOSPITAL_BASED_OUTPATIENT_CLINIC_OR_DEPARTMENT_OTHER): Payer: Self-pay | Admitting: Emergency Medicine

## 2013-11-20 NOTE — Telephone Encounter (Signed)
Post ED Visit - Positive Culture Follow-up  Culture report reviewed by antimicrobial stewardship pharmacist: []  Wes Dulaney, Pharm.D., BCPS []  Celedonio MiyamotoJeremy Frens, Pharm.D., BCPS []  Georgina PillionElizabeth Martin, Pharm.D., BCPS [x]  BrewertonMinh Pham, 17[address] Rainbow BoulevardPharm.D., BCPS, AAHIVP []  Estella HuskMichelle Turner, Pharm.D., BCPS, AAHIVP  Positive urine culture Treated with Keflex, organism sensitive to the same and no further patient follow-up is required at this time.  Zeb ComfortHolland, Amaru Burroughs 11/20/2013, 12:52 PM

## 2014-12-31 ENCOUNTER — Encounter (HOSPITAL_COMMUNITY): Payer: Self-pay | Admitting: Oncology

## 2014-12-31 ENCOUNTER — Emergency Department (HOSPITAL_COMMUNITY)
Admission: EM | Admit: 2014-12-31 | Discharge: 2015-01-01 | Disposition: A | Discharge status: Home / Self Care | Payer: Medicaid Other | Attending: Emergency Medicine | Admitting: Emergency Medicine

## 2014-12-31 DIAGNOSIS — Z3A24 24 weeks gestation of pregnancy: Secondary | ICD-10-CM | POA: Insufficient documentation

## 2014-12-31 DIAGNOSIS — F1924 Other psychoactive substance dependence with psychoactive substance-induced mood disorder: Secondary | ICD-10-CM | POA: Insufficient documentation

## 2014-12-31 DIAGNOSIS — F1320 Sedative, hypnotic or anxiolytic dependence, uncomplicated: Secondary | ICD-10-CM | POA: Diagnosis present

## 2014-12-31 DIAGNOSIS — Y288XXA Contact with other sharp object, undetermined intent, initial encounter: Secondary | ICD-10-CM | POA: Diagnosis not present

## 2014-12-31 DIAGNOSIS — Z720 Tobacco use: Secondary | ICD-10-CM | POA: Diagnosis not present

## 2014-12-31 DIAGNOSIS — F1994 Other psychoactive substance use, unspecified with psychoactive substance-induced mood disorder: Secondary | ICD-10-CM

## 2014-12-31 DIAGNOSIS — Y998 Other external cause status: Secondary | ICD-10-CM | POA: Diagnosis not present

## 2014-12-31 DIAGNOSIS — F1310 Sedative, hypnotic or anxiolytic abuse, uncomplicated: Secondary | ICD-10-CM | POA: Diagnosis not present

## 2014-12-31 DIAGNOSIS — F1210 Cannabis abuse, uncomplicated: Secondary | ICD-10-CM | POA: Insufficient documentation

## 2014-12-31 DIAGNOSIS — Y9389 Activity, other specified: Secondary | ICD-10-CM | POA: Diagnosis not present

## 2014-12-31 DIAGNOSIS — Z8619 Personal history of other infectious and parasitic diseases: Secondary | ICD-10-CM | POA: Diagnosis not present

## 2014-12-31 DIAGNOSIS — S80211A Abrasion, right knee, initial encounter: Secondary | ICD-10-CM | POA: Diagnosis not present

## 2014-12-31 DIAGNOSIS — F11220 Opioid dependence with intoxication, uncomplicated: Secondary | ICD-10-CM | POA: Diagnosis not present

## 2014-12-31 DIAGNOSIS — F1920 Other psychoactive substance dependence, uncomplicated: Secondary | ICD-10-CM

## 2014-12-31 DIAGNOSIS — F11229 Opioid dependence with intoxication, unspecified: Secondary | ICD-10-CM

## 2014-12-31 DIAGNOSIS — Y92009 Unspecified place in unspecified non-institutional (private) residence as the place of occurrence of the external cause: Secondary | ICD-10-CM | POA: Insufficient documentation

## 2014-12-31 DIAGNOSIS — F1120 Opioid dependence, uncomplicated: Secondary | ICD-10-CM | POA: Diagnosis present

## 2014-12-31 DIAGNOSIS — Z8739 Personal history of other diseases of the musculoskeletal system and connective tissue: Secondary | ICD-10-CM | POA: Insufficient documentation

## 2014-12-31 DIAGNOSIS — O99342 Other mental disorders complicating pregnancy, second trimester: Secondary | ICD-10-CM | POA: Diagnosis not present

## 2014-12-31 DIAGNOSIS — Z3490 Encounter for supervision of normal pregnancy, unspecified, unspecified trimester: Secondary | ICD-10-CM

## 2014-12-31 DIAGNOSIS — R45851 Suicidal ideations: Secondary | ICD-10-CM

## 2014-12-31 DIAGNOSIS — Z79899 Other long term (current) drug therapy: Secondary | ICD-10-CM | POA: Diagnosis not present

## 2014-12-31 HISTORY — DX: Suicide attempt, initial encounter: T14.91XA

## 2014-12-31 LAB — CBC
HEMATOCRIT: 24.3 % — AB (ref 36.0–46.0)
Hemoglobin: 8.3 g/dL — ABNORMAL LOW (ref 12.0–15.0)
MCH: 33.6 pg (ref 26.0–34.0)
MCHC: 34.2 g/dL (ref 30.0–36.0)
MCV: 98.4 fL (ref 78.0–100.0)
PLATELETS: 226 10*3/uL (ref 150–400)
RBC: 2.47 MIL/uL — ABNORMAL LOW (ref 3.87–5.11)
RDW: 13.9 % (ref 11.5–15.5)
WBC: 5 10*3/uL (ref 4.0–10.5)

## 2014-12-31 LAB — COMPREHENSIVE METABOLIC PANEL
ALT: 12 U/L — AB (ref 14–54)
ANION GAP: 10 (ref 5–15)
AST: 19 U/L (ref 15–41)
Albumin: 3 g/dL — ABNORMAL LOW (ref 3.5–5.0)
Alkaline Phosphatase: 47 U/L (ref 38–126)
BUN: 6 mg/dL (ref 6–20)
CALCIUM: 8.7 mg/dL — AB (ref 8.9–10.3)
CO2: 20 mmol/L — AB (ref 22–32)
Chloride: 111 mmol/L (ref 101–111)
Creatinine, Ser: 0.51 mg/dL (ref 0.44–1.00)
GFR calc Af Amer: >60 mL/min (ref >60)
GFR calc non Af Amer: >60 mL/min (ref >60)
GLUCOSE: 87 mg/dL (ref 65–99)
Potassium: 3.3 mmol/L — ABNORMAL LOW (ref 3.5–5.1)
Sodium: 141 mmol/L (ref 135–145)
Total Bilirubin: 0.4 mg/dL (ref 0.3–1.2)
Total Protein: 6.2 g/dL — ABNORMAL LOW (ref 6.5–8.1)

## 2014-12-31 LAB — SALICYLATE LEVEL: Salicylate Lvl: <4 mg/dL (ref 2.8–30.0)

## 2014-12-31 LAB — ETHANOL: Alcohol, Ethyl (B): 311 mg/dL (ref <5)

## 2014-12-31 LAB — ACETAMINOPHEN LEVEL: Acetaminophen (Tylenol), Serum: <10 ug/mL — ABNORMAL LOW (ref 10–30)

## 2014-12-31 NOTE — ED Notes (Signed)
Called to [facility] for overdose of 6 month pregnant patient. Ar[facility] to [facility] with some prenatal care at "[facility]" FHR 160 no UCs.Pt intoxicated. Call to Dr Erin Fulling. Order for QS NST.

## 2014-12-31 NOTE — ED Notes (Signed)
Pt cleared by rapid OB  

## 2014-12-31 NOTE — ED Provider Notes (Signed)
CSN: [id number]     Arrival date & time 12/31/14  2009 History   First MD Initiated Contact with Patient 12/31/14 2035     Chief Complaint  Patient presents with  . Suicide Attempt     (Consider location/radiation/quality/duration/timing/severity/associated sxs/prior Treatment) Patient is a 39 y.o. female presenting with mental health disorder.  Mental Health Problem Presenting symptoms: aggressive behavior, agitation, self mutilation and suicidal thoughts   Presenting symptoms: no homicidal ideas and no paranoid behavior   Degree of incapacity (severity):  Moderate Onset quality:  Gradual Duration:  3 days Timing:  Constant Progression:  Worsening Chronicity:  New Context: alcohol use, drug abuse and stressful life event   Context: not medication and not recent medication change   Treatment compliance:  Untreated Relieved by:  None tried Worsened by:  Nothing tried Ineffective treatments:  None tried Associated symptoms: anxiety   Associated symptoms: no abdominal pain, no chest pain, no hyperventilation, no insomnia and no irritability     Past Medical History  Diagnosis Date  . Seizures   . Back ache   . HIV (human immunodeficiency virus infection)   . Hepatitis   . DDD (degenerative disc disease)   . Pancreatitis   . Suicide attempt    Past Surgical History  Procedure Laterality Date  . Back surgery     Family History  Problem Relation Age of Onset  . Hypertension Father   . Hypertension Paternal Grandmother   . Hypertension Paternal Grandfather    Social History  Substance Use Topics  . Smoking status: Current Every Day Smoker -- 1.00 packs/day    Types: Cigarettes  . Smokeless tobacco: Never Used  . Alcohol Use: Yes     Comment: occasional   OB History    Gravida Para Term Preterm AB TAB SAB Ectopic Multiple Living   1              Review of Systems  Constitutional: Negative for fever, chills and irritability.  HENT: Negative for drooling and ear  pain.   Eyes: Negative for photophobia and pain.  Respiratory: Negative for cough, choking and shortness of breath.   Cardiovascular: Negative for chest pain and palpitations.  Gastrointestinal: Negative for nausea and abdominal pain.  Endocrine: Negative for polydipsia.  Genitourinary: Negative for hematuria and enuresis.  Musculoskeletal: Negative for gait problem.  Psychiatric/Behavioral: Positive for suicidal ideas, self-injury and agitation. Negative for homicidal ideas and paranoia. The patient is nervous/anxious. The patient does not have insomnia.   All other systems reviewed and are negative.     Allergies  Sulfa antibiotics  Home Medications   Prior to Admission medications   Medication Sig Start Date End Date Taking? Authorizing Provider  alprazolam Prudy Feeler) 2 MG tablet Take 2 mg by mouth 4 (four) times daily as needed for sleep.   Yes Historical Provider, MD  Doxylamine-Pyridoxine 10-10 MG TBEC Take 1 tablet by mouth daily as needed. Nausea/vomitting 09/15/14  Yes Historical Provider, MD  nitrofurantoin, macrocrystal-monohydrate, (MACROBID) 100 MG capsule Take 100 mg by mouth daily. continous 12/28/14 01/27/15 Yes Historical Provider, MD  ondansetron (ZOFRAN-ODT) 4 MG disintegrating tablet Take 4 mg by mouth every 8 (eight) hours as needed. n/v   Yes Historical Provider, MD  oxycodone (ROXICODONE) 30 MG immediate release tablet Take 30 mg by mouth every 4 (four) hours as needed for pain.   Yes Historical Provider, MD  Prenatal Vit-Fe Fumarate-FA (PRENATAL VITAMIN) 27-0.8 MG TABS Take 1 tablet by mouth daily. 09/15/14  Yes  Historical Provider, MD  promethazine (PHENERGAN) 12.5 MG tablet Take 12.5 mg by mouth daily as needed. n/v 12/28/14  Yes Historical Provider, MD  emtricitabine-rilpivir-tenofovir AF (ODEFSEY) 200-25-25 MG TABS per tablet Take 1 tablet by mouth daily. 11/10/14   Historical Provider, MD   BP 102/75 mmHg  Pulse 105  Temp(Src) 98.1 F (36.7 C) (Oral)  Resp 21   SpO2 97% Physical Exam  Constitutional: She is oriented to person, place, and time. She appears well-developed and well-nourished.  HENT:  Head: Normocephalic and atraumatic.  Eyes: Conjunctivae and EOM are normal. Right eye exhibits no discharge. Left eye exhibits no discharge.  Cardiovascular: Normal rate and regular rhythm.   Pulmonary/Chest: Effort normal and breath sounds normal. No respiratory distress.  Abdominal: Soft. She exhibits no distension. There is no tenderness. There is no rebound.  Musculoskeletal: Normal range of motion. She exhibits no edema or tenderness.  Neurological: She is alert and oriented to person, place, and time.  Skin: Skin is warm and dry.  Multiple Superficial lacerations to bilateral upper extremities and just above the right knee.  Nursing note and vitals reviewed.   ED Course  Procedures (including critical care time) Labs Review Labs Reviewed  COMPREHENSIVE METABOLIC PANEL - Abnormal; Notable for the following:    Potassium 3.3 (*)    CO2 20 (*)    Calcium 8.7 (*)    Total Protein 6.2 (*)    Albumin 3.0 (*)    ALT 12 (*)    All other components within normal limits  ETHANOL - Abnormal; Notable for the following:    Alcohol, Ethyl (B) 311 (*)    All other components within normal limits  ACETAMINOPHEN LEVEL - Abnormal; Notable for the following:    Acetaminophen (Tylenol), Serum <10 (*)    All other components within normal limits  CBC - Abnormal; Notable for the following:    RBC 2.47 (*)    Hemoglobin 8.3 (*)    HCT 24.3 (*)    All other components within normal limits  SALICYLATE LEVEL  URINE RAPID DRUG SCREEN, HOSP PERFORMED    Imaging Review No results found. I, Lucia Harm, Barbara Cower, personally reviewed and evaluated these images and lab results as part of my medical decision-making.   EKG Interpretation   Date/Time:  Sunday December 31 2014 20:15:15 EDT Ventricular Rate:  112 PR Interval:  140 QRS Duration: 73 QT Interval:   318 QTC Calculation: 434 R Axis:   50 Text Interpretation:  Sinus tachycardia Low voltage, precordial leads  Confirmed by COOK  MD, BRIAN ([id number]) on 12/31/2014 8:34:18 PM      MDM   Final diagnoses:  Uncomplicated opioid dependence  Pregnancy  Opioid dependence with intoxication with complication  Suicidal ideation   39 year old female at [redacted] weeks gestation presents to the emergency department today for a subtle thoughts, self-mutilation, intoxication and recent drug overdose. Had a recent stressor with a not say who died from overdose 4 days ago. Patient doesn't feel like living however he says that she will lift her baby. However she is engaging in very self-destructive behaviors and I'm concerned for the health of her fetus. Exam she has multiple superficial lacerations none of which are knee repair. Heart lungs normal. Patient without any evidence of having contractions. Fetal heart rate in the 160s on my evaluation without any evidence of contractions on toco. The patient is a danger to herself and her fetus will IVC her and consult TTS for placement.  Marily Memos, MD 01/01/15 [phone]

## 2014-12-31 NOTE — ED Notes (Signed)
Pt BIB EMS from home, EMS called by family member, Pt reports cutting herself 4 days ago, scratches noted to neck, arms and legs. Pt OD on heroin 4 days ago per patient, pts fiance died 4 days ago from OD. Pt reports drinking 1 bottle of Vodka today, pt is 6 months pregnant.  Pt was evaluated by Roma Kayser 4 days ago.

## 2014-12-31 NOTE — Progress Notes (Signed)
Patient alert and oriented x 4. Patient sluggish during assessment. Patient reports she is 6 months pregnant with a girl.  She was at [facility] medical for OD 4 days ago, "I OD'ed on heroine, my boyfriend died of heroine overdose and just buried him 4 days ago and that's when I OD'ed." Patient states she has been drinking for the past 4 days with today she drank a 1/5 of vodka, and used heroine. Patient reports she is a smoker, and smokes 1 pack a day for the last 20 years. Patient denies SI/HI/AVH. Patient states, "I just don't want to hurt my baby."  Patient states she does have chronic pain in lower back 7/10. Patient refused medication. Patient is being closely monitored with 15 min checks.

## 2014-12-31 NOTE — ED Notes (Addendum)
Pt has shirt,shorts,bra,flip flops. Pt has a purse with 3 coolpad cell phones one has a cracked screen. Pt has 1 silver colored ring and a silvercolored bracelet that she threw at her purse. Pt has 1 silver colored ring locked with security. There is makeup,a razor,a lighter,and a pair of sunglasses. The phones are locked up with security. Purse not thoroughly searched due to possibility of sharps. Pt belongings locked in locker 30 in [place]

## 2014-12-31 NOTE — ED Notes (Signed)
Bed: [facility] Expected date:  Expected time:  Means of arrival:  Comments: Hold for Ansen Sayegh

## 2015-01-01 ENCOUNTER — Encounter (HOSPITAL_COMMUNITY): Payer: Self-pay | Admitting: *Deleted

## 2015-01-01 DIAGNOSIS — F1994 Other psychoactive substance use, unspecified with psychoactive substance-induced mood disorder: Secondary | ICD-10-CM | POA: Diagnosis not present

## 2015-01-01 DIAGNOSIS — F1920 Other psychoactive substance dependence, uncomplicated: Secondary | ICD-10-CM | POA: Diagnosis present

## 2015-01-01 LAB — RAPID URINE DRUG SCREEN, HOSP PERFORMED
Amphetamines: NOT DETECTED
BENZODIAZEPINES: POSITIVE — AB
Barbiturates: NOT DETECTED
COCAINE: NOT DETECTED
OPIATES: NOT DETECTED
Tetrahydrocannabinol: POSITIVE — AB

## 2015-01-01 MED ORDER — VITAMIN B-1 100 MG PO TABS: 100.0000 mg | ORAL_TABLET | Freq: Every day | ORAL | Status: DC

## 2015-01-01 MED ORDER — LORAZEPAM 1 MG PO TABS
0.0000 mg | ORAL_TABLET | Freq: Four times a day (QID) | ORAL | Status: DC
Start: 2015-01-01 — End: 2015-01-01

## 2015-01-01 MED ORDER — LORAZEPAM 1 MG PO TABS: 0.0000 mg | ORAL_TABLET | Freq: Two times a day (BID) | ORAL | Status: DC

## 2015-01-01 MED ORDER — THIAMINE HCL 100 MG/ML IJ SOLN: 100.0000 mg | Freq: Every day | INTRAMUSCULAR | Status: DC

## 2015-01-01 MED ORDER — PRENATAL MULTIVITAMIN CH
1.0000 | ORAL_TABLET | Freq: Every day | ORAL | Status: DC
  Administered 2015-01-01: 1 via ORAL
  Filled 2015-01-01: qty 1

## 2015-01-01 MED ORDER — NICOTINE POLACRILEX 2 MG MT GUM
2.0000 mg | CHEWING_GUM | OROMUCOSAL | Status: DC | PRN
  Administered 2015-01-01: 2 mg via ORAL
  Filled 2015-01-01: qty 1

## 2015-01-01 MED ORDER — LORAZEPAM 2 MG/ML IJ SOLN
0.0000 mg | Freq: Two times a day (BID) | INTRAMUSCULAR | Status: DC
Start: 2015-01-01 — End: 2015-01-01

## 2015-01-01 MED ORDER — EMTRICITAB-RILPIVIR-TENOFOV AF 200-25-25 MG PO TABS
1.0000 | ORAL_TABLET | Freq: Every day | ORAL | Status: DC
  Filled 2015-01-01: qty 1

## 2015-01-01 MED ORDER — LORAZEPAM 2 MG/ML IJ SOLN
0.0000 mg | Freq: Four times a day (QID) | INTRAMUSCULAR | Status: DC
Start: 2015-01-01 — End: 2015-01-01

## 2015-01-01 NOTE — [facility] Suicide Risk Assessment (Signed)
Suicide Risk Assessment  Discharge Assessment   [facility] Discharge Suicide Risk Assessment   Demographic Factors:  NA  Total Time spent with patient: 45 minutes  Musculoskeletal: Strength & Muscle Tone: within normal limits Gait & Station: normal Patient leans: N/A  Psychiatric Specialty Exam: Physical Exam  Review of Systems  Constitutional: Negative.   HENT: Negative.   Eyes: Negative.   Respiratory: Negative.   Cardiovascular: Negative.   Gastrointestinal: Negative.   Genitourinary: Negative.   Musculoskeletal: Negative.   Skin: Negative.   Neurological: Negative.   Endo/Heme/Allergies: Negative.   Psychiatric/Behavioral: Positive for depression and substance abuse.    Blood pressure 99/59, pulse 90, temperature 98.5 F (36.9 C), temperature source Oral, resp. rate 20, SpO2 96 %.There is no weight on file to calculate BMI.  General Appearance: Casual  Eye Contact::  Good  Speech:  Normal Rate  Volume:  Normal  Mood:  Depressed  Affect:  Congruent  Thought Process:  Coherent  Orientation:  Full (Time, Place, and Person)  Thought Content:  WDL  Suicidal Thoughts:  No  Homicidal Thoughts:  No  Memory:  Immediate;   Good Recent;   Good Remote;   Good  Judgement:  Fair  Insight:  Lacking  Psychomotor Activity:  Normal  Concentration:  Good  Recall:  Good  Fund of Knowledge:Good  Language: Good  Akathisia:  No  Handed:  Right  AIMS (if indicated):     Assets:  Housing Leisure Time Physical Health Resilience Social Support  ADL's:  Intact  Cognition: WNL  Sleep:      Have you used any form of tobacco in the last 30 days? (Cigarettes, Smokeless Tobacco, Cigars, and/or Pipes): Yes  Has this patient used any form of tobacco in the last 30 days? (Cigarettes, Smokeless Tobacco, Cigars, and/or Pipes) Yes, A prescription for an FDA-approved tobacco cessation medication was offered at discharge and the patient refused  Mental Status Per Nursing Assessment::   On  Admission:   Alcohol intoxication  Current Mental Status by Physician: NA  Loss Factors: NA  Historical Factors: NA  Risk Reduction Factors:   Sense of responsibility to family, Living with another person, especially a relative and Positive social support  Continued Clinical Symptoms:  None  Cognitive Features That Contribute To Risk:  None    Suicide Risk:  Minimal: No identifiable suicidal ideation.  Patients presenting with no risk factors but with morbid ruminations; may be classified as minimal risk based on the severity of the depressive symptoms  Principal Problem: Substance induced mood disorder Discharge Diagnoses:  Patient Active Problem List   Diagnosis Date Noted  . Polysubstance (including opioids) dependence with physiological dependence [F19.20] 01/01/2015    Priority: High  . Opioid dependence [F11.20] 05/04/2013    Priority: High  . 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 Substance induced mood disorder [F19.94] 05/03/2013    Priority: High  . Alcohol abuse [F10.10] 05/04/2013  . Polysubstance overdose [T50.901A] 05/01/2013  . HIV disease [B20] 05/01/2013  . Altered mental status [R41.82] 05/01/2013  . Seizure [R56.9] 05/01/2013  . Narcotic abuse [F11.10] 05/01/2013      Plan Of Care/Follow-up recommendations:  Activity:  as tolerated Diet:  heart healthy diet  Is patient on multiple antipsychotic therapies at discharge:  No   Has Patient had three or more failed trials of antipsychotic monotherapy by history:  No  Recommended Plan for Multiple Antipsychotic Therapies: NA    Anjelika Ausburn, PMH-NP 01/01/2015, 12:23  PM

## 2015-01-01 NOTE — Consult Note (Signed)
[facility] Consult   Reason for Consult:  Substance induced mood disorder Referring Physician:  EDP Patient Identification: Alicia Hayes MRN:  [id number] Principal Diagnosis: Substance induced mood disorder Diagnosis:   Patient Active Problem List   Diagnosis Date Noted  . Polysubstance (including opioids) dependence with physiological dependence [F19.20] 01/01/2015    Priority: High  . Opioid dependence [F11.20] 05/04/2013    Priority: High  . 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 Substance induced mood disorder [F19.94] 05/03/2013    Priority: High  . Alcohol abuse [F10.10] 05/04/2013  . Polysubstance overdose [T50.901A] 05/01/2013  . HIV disease [B20] 05/01/2013  . Altered mental status [R41.82] 05/01/2013  . Seizure [R56.9] 05/01/2013  . Narcotic abuse [F11.10] 05/01/2013    Total Time spent with patient: 45 minutes  Subjective:   Alicia Hayes is a 39 y.o. female patient does not warrant admission.  HPI:  The patient denies suicidal/homicidal ideations, hallucinations.  She does not want assistance with her substance abuse as she does not feel she has an issue despite use of heroin, alcohol, benzodiazepines, etc. Frequently.  Prior to admission she drank a fifth of vodka, but states she was upset about the death of her fiance who died last 20-Jul-2022 from an accidental overdose of heroin.  She also accidentally overdosed on heroin last Thursday but denies any suicide attempt or past attempts.  Hector also states she does not want to hurt her baby, six months pregnant, and sees a high risk pregnancy clinic at [facility].  Reports marijuana use for her nausea.  Education about harmful affects to her baby provided in a non-accusatory tone.  The patient does not want to come in for treatment, does admit to depression (situational) due to the death of her fiance. HPI Elements:   Location:  generalized. Quality:  acute. Severity:   mild. Timing:  intermittent. Duration:  brief. Context:  stressors.  Past Medical History:  Past Medical History  Diagnosis Date  . Seizures   . Back ache   . HIV (human immunodeficiency virus infection)   . Hepatitis   . DDD (degenerative disc disease)   . Pancreatitis   . Suicide attempt     Past Surgical History  Procedure Laterality Date  . Back surgery     Family History:  Family History  Problem Relation Age of Onset  . Hypertension Father   . Hypertension Paternal Grandmother   . Hypertension Paternal Grandfather    Social History:  History  Alcohol Use  . Yes    Comment: occasional     History  Drug Use  . Yes  . Special: Oxycodone    Comment: oxy and xanax prescribed per pt & heroin    Social History   Social History  . Marital Status: Divorced    Spouse Name: N/A  . Number of Children: N/A  . Years of Education: N/A   Social History Main Topics  . Smoking status: Current Every Day Smoker -- 1.00 packs/day    Types: Cigarettes  . Smokeless tobacco: Never Used  . Alcohol Use: Yes     Comment: occasional  . Drug Use: Yes    Special: Oxycodone     Comment: oxy and xanax prescribed per pt & heroin  . Sexual Activity: Yes    Birth Control/ Protection: None   Other Topics Concern  . None   Social History Narrative   Additional Social History:    Pain Medications: See PTA List;  Pt has hx of abuse of opioids Prescriptions: See PTA List Over the Counter: See PTA List History of alcohol / drug use?: Yes Negative Consequences of Use: Financial, Personal relationships Withdrawal Symptoms: Patient aware of relationship between substance abuse and physical/medical complications, Nausea / Vomiting, Irritability, Fever / Chills, Weakness Name of Substance 1: Opiates / Opioids 1 - Age of First Use: UTA 1 - Amount (size/oz): Multiple 13m Roxicodone tabs; Various amounts of heroin 1 - Frequency: Daily 1 - Duration: UTA 1 - Last Use / Amount: Last  night, 12/31/14 Name of Substance 2: Benzodiazepines 2 - Age of First Use: UTA 2 - Amount (size/oz): Multiple 227mXanax pills 2 - Frequency: Daily 2 - Duration: UTA 2 - Last Use / Amount: UTA Name of Substance 3: Etoh 3 - Age of First Use: teens 3 - Amount (size/oz): a fifth of vodka 3 - Frequency: Several times per week 3 - Duration: years 3 - Last Use / Amount: Last night, 12/31/14               Allergies:   Allergies  Allergen Reactions  . Sulfa Antibiotics Hives and Itching    Labs:  Results for orders placed or performed during the hospital encounter of 12/31/14 (from the past 48 hour(s))  Comprehensive metabolic panel     Status: Abnormal   Collection Time: 12/31/14  9:00 PM  Result Value Ref Range   Sodium 141 135 - 145 mmol/L   Potassium 3.3 (L) 3.5 - 5.1 mmol/L   Chloride 111 101 - 111 mmol/L   CO2 20 (L) 22 - 32 mmol/L   Glucose, Bld 87 65 - 99 mg/dL   BUN 6 6 - 20 mg/dL   Creatinine, Ser 0.51 0.44 - 1.00 mg/dL   Calcium 8.7 (L) 8.9 - 10.3 mg/dL   Total Protein 6.2 (L) 6.5 - 8.1 g/dL   Albumin 3.0 (L) 3.5 - 5.0 g/dL   AST 19 15 - 41 U/L   ALT 12 (L) 14 - 54 U/L   Alkaline Phosphatase 47 38 - 126 U/L   Total Bilirubin 0.4 0.3 - 1.2 mg/dL   GFR calc non Af Amer >60 >60 mL/min   GFR calc Af Amer >60 >60 mL/min    Comment: (NOTE) The eGFR has been calculated using the CKD EPI equation. This calculation has not been validated in all clinical situations. eGFR's persistently <60 mL/min signify possible Chronic Kidney Disease.    Anion gap 10 5 - 15  Ethanol (ETOH)     Status: Abnormal   Collection Time: 12/31/14  9:00 PM  Result Value Ref Range   Alcohol, Ethyl (B) 311 (HH) <5 mg/dL    Comment:        LOWEST DETECTABLE LIMIT FOR SERUM ALCOHOL IS 5 mg/dL FOR MEDICAL PURPOSES ONLY CRITICAL RESULT CALLED TO, READ BACK BY AND VERIFIED WITH: LEDWELL,A RN [id number],N   Salicylate level     Status: None   Collection Time: 12/31/14  9:00 PM   Result Value Ref Range   Salicylate Lvl <4<4.3.8 - 30.0 mg/dL  Acetaminophen level     Status: Abnormal   Collection Time: 12/31/14  9:00 PM  Result Value Ref Range   Acetaminophen (Tylenol), Serum <10 (L) 10 - 30 ug/mL    Comment:        THERAPEUTIC CONCENTRATIONS VARY SIGNIFICANTLY. A RANGE OF 10-30 ug/mL MAY BE AN EFFECTIVE CONCENTRATION FOR MANY PATIENTS. HOWEVER, SOME ARE BEST TREATED AT CONCENTRATIONS OUTSIDE  THIS RANGE. ACETAMINOPHEN CONCENTRATIONS >150 ug/mL AT 4 HOURS AFTER INGESTION AND >50 ug/mL AT 12 HOURS AFTER INGESTION ARE OFTEN ASSOCIATED WITH TOXIC REACTIONS.   CBC     Status: Abnormal   Collection Time: 12/31/14  9:00 PM  Result Value Ref Range   WBC 5.0 4.0 - 10.5 K/uL   RBC 2.47 (L) 3.87 - 5.11 MIL/uL   Hemoglobin 8.3 (L) 12.0 - 15.0 g/dL   HCT 24.3 (L) 36.0 - 46.0 %   MCV 98.4 78.0 - 100.0 fL   MCH 33.6 26.0 - 34.0 pg   MCHC 34.2 30.0 - 36.0 g/dL   RDW 13.9 11.5 - 15.5 %   Platelets 226 150 - 400 K/uL  Urine rapid drug screen (hosp performed) (Not at [facility])     Status: Abnormal   Collection Time: 01/01/15  6:23 AM  Result Value Ref Range   Opiates NONE DETECTED NONE DETECTED   Cocaine NONE DETECTED NONE DETECTED   Benzodiazepines POSITIVE (A) NONE DETECTED   Amphetamines NONE DETECTED NONE DETECTED   Tetrahydrocannabinol POSITIVE (A) NONE DETECTED   Barbiturates NONE DETECTED NONE DETECTED    Comment:        DRUG SCREEN FOR MEDICAL PURPOSES ONLY.  IF CONFIRMATION IS NEEDED FOR ANY PURPOSE, NOTIFY LAB WITHIN 5 DAYS.        LOWEST DETECTABLE LIMITS FOR URINE DRUG SCREEN Drug Class       Cutoff (ng/mL) Amphetamine      1000 Barbiturate      200 Benzodiazepine   751 Tricyclics       025 Opiates          300 Cocaine          300 THC              50     Vitals: Blood pressure 99/59, pulse 90, temperature 98.5 F (36.9 C), temperature source Oral, resp. rate 20, SpO2 96 %.  Risk to Self: Suicidal Ideation: Yes-Currently Present Suicidal  Intent: No-Not Currently/Within Last 6 Months Is patient at risk for suicide?: Yes Suicidal Plan?: No-Not Currently/Within Last 6 Months Access to Means: Yes Specify Access to Suicidal Means: Access to legal and illegal drugs What has been your use of drugs/alcohol within the last 12 months?: Opiate, Benzo, and Etoh abuse How many times?:  (UTA) Other Self Harm Risks: SA, Cutting Triggers for Past Attempts: Other personal contacts, Other (Comment) (Pt's fiance died of heroin OD and was buried 4 days ago) Intentional Self Injurious Behavior: Cutting Comment - Self Injurious Behavior: Pt cut self on day of fiance's funeral Risk to Others: Homicidal Ideation: No Thoughts of Harm to Others: No Current Homicidal Intent: No Current Homicidal Plan: No Access to Homicidal Means: No Identified Victim: n/a History of harm to others?: No Assessment of Violence: None Noted Violent Behavior Description: Pt calm and cooperative; No known hx of violence Does patient have access to weapons?: No Criminal Charges Pending?: No Does patient have a court date: No Prior Inpatient Therapy: Prior Inpatient Therapy: Yes Prior Therapy Dates: 2014 Prior Therapy Facilty/Provider(s): [facility] Reason for Treatment: SA/Etoh abuse Prior Outpatient Therapy: Prior Outpatient Therapy:  (UTA) Does patient have an ACCT team?: No Does patient have Intensive In-House Services?  : No Does patient have [facility] services? : No Does patient have P4CC services?: No  Current Facility-Administered Medications  Medication Dose Route Frequency Provider Last Rate Last Dose  . emtricitabine-rilpivir-tenofovir AF (ODEFSEY) 200-25-25 MG per tablet 1 tablet  1 tablet  Oral Daily Merrily Pew, MD      . nicotine polacrilex (NICORETTE) gum 2 mg  2 mg Oral Q2H PRN Patrecia Pour, NP   2 mg at 01/01/15 1018  . prenatal multivitamin tablet 1 tablet  1 tablet Oral Daily Merrily Pew, MD   1 tablet at 01/01/15 1018   Current Outpatient  Prescriptions  Medication Sig Dispense Refill  . alprazolam (XANAX) 2 MG tablet Take 2 mg by mouth 4 (four) times daily as needed for sleep.    Marland Kitchen Doxylamine-Pyridoxine 10-10 MG TBEC Take 1 tablet by mouth daily as needed. Nausea/vomitting    . nitrofurantoin, macrocrystal-monohydrate, (MACROBID) 100 MG capsule Take 100 mg by mouth daily. continous    . ondansetron (ZOFRAN-ODT) 4 MG disintegrating tablet Take 4 mg by mouth every 8 (eight) hours as needed. n/v    . oxycodone (ROXICODONE) 30 MG immediate release tablet Take 30 mg by mouth every 4 (four) hours as needed for pain.    . Prenatal Vit-Fe Fumarate-FA (PRENATAL VITAMIN) 27-0.8 MG TABS Take 1 tablet by mouth daily.    . promethazine (PHENERGAN) 12.5 MG tablet Take 12.5 mg by mouth daily as needed. n/v    . emtricitabine-rilpivir-tenofovir AF (ODEFSEY) 200-25-25 MG TABS per tablet Take 1 tablet by mouth daily.      Musculoskeletal: Strength & Muscle Tone: within normal limits Gait & Station: normal Patient leans: N/A  Psychiatric Specialty Exam: Physical Exam  Review of Systems  Constitutional: Negative.   HENT: Negative.   Eyes: Negative.   Respiratory: Negative.   Cardiovascular: Negative.   Gastrointestinal: Negative.   Genitourinary: Negative.   Musculoskeletal: Negative.   Skin: Negative.   Neurological: Negative.   Endo/Heme/Allergies: Negative.   Psychiatric/Behavioral: Positive for depression and substance abuse.    Blood pressure 99/59, pulse 90, temperature 98.5 F (36.9 C), temperature source Oral, resp. rate 20, SpO2 96 %.There is no weight on file to calculate BMI.  General Appearance: Casual  Eye Contact::  Good  Speech:  Normal Rate  Volume:  Normal  Mood:  Depressed  Affect:  Congruent  Thought Process:  Coherent  Orientation:  Full (Time, Place, and Person)  Thought Content:  WDL  Suicidal Thoughts:  No  Homicidal Thoughts:  No  Memory:  Immediate;   Good Recent;   Good Remote;   Good  Judgement:   Fair  Insight:  Lacking  Psychomotor Activity:  Normal  Concentration:  Good  Recall:  Good  Fund of Knowledge:Good  Language: Good  Akathisia:  No  Handed:  Right  AIMS (if indicated):     Assets:  Housing Leisure Time Physical Health Resilience Social Support  ADL's:  Intact  Cognition: WNL  Sleep:      Medical Decision Making: Review of Psycho-Social Stressors (1), Review or order clinical lab tests (1) and Review of Medication Regimen & Side Effects (2)  Treatment Plan Summary: Daily contact with patient to assess and evaluate symptoms and progress in treatment, Medication management and Plan Substance induced mood disorder:  -Crisis stabilization -Substance abuse counseling -Resources for assistance  Plan:  No evidence of imminent risk to self or others at present.   Disposition: Discharge home with follow-up with her high risk ob/gyn team at [facility] [place] 01/01/2015 11:57 AM Patient seen face-to-face for psychiatric evaluation, chart reviewed and case discussed with the physician extender and developed treatment plan. Reviewed the information documented and agree with the treatment plan. Corena Pilgrim, MD

## 2015-01-01 NOTE — [facility] Counselor (Signed)
Disposition: Hulan Fess, NP recommends inpatient psychiatric treatment. TTS will contact appropriate facilities for placement.   Counselor informed Dr. Clayborne Dana of disposition and need for placement elsewhere due to pt's pregnancy and need fpr detox.   Cyndie Mull, [facility] Triage Specialist

## 2015-01-01 NOTE — BH Assessment (Signed)
[facility] Assessment Progress Note  Per Thedore Mins, MD, this pt does not require psychiatric hospitalization at this time. Pt is to be released from [facility] and discharged from [facility]. She already has outpatient providers in place.  IVC has been rescinded. Pt's nurse, Dawnaly, has been notified.  Doylene Canning, MA Triage Specialist [phone]

## 2015-01-01 NOTE — ED Notes (Signed)
Patient discharged to home.  Denies thoughts of harm to self or others.  All belongings returned and signed for.  Left the unit ambulatory and was escorted to the lobby to await her ride.
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
## 2015-03-25 IMAGING — CR DG CHEST 1V PORT
1 series · 1 of 1 positions shown · non-contrast
Comparison: None.

CLINICAL DATA: Overdose.  Patient unresponsive.

EXAM:
PORTABLE CHEST - 1 VIEW

[x chest ap]
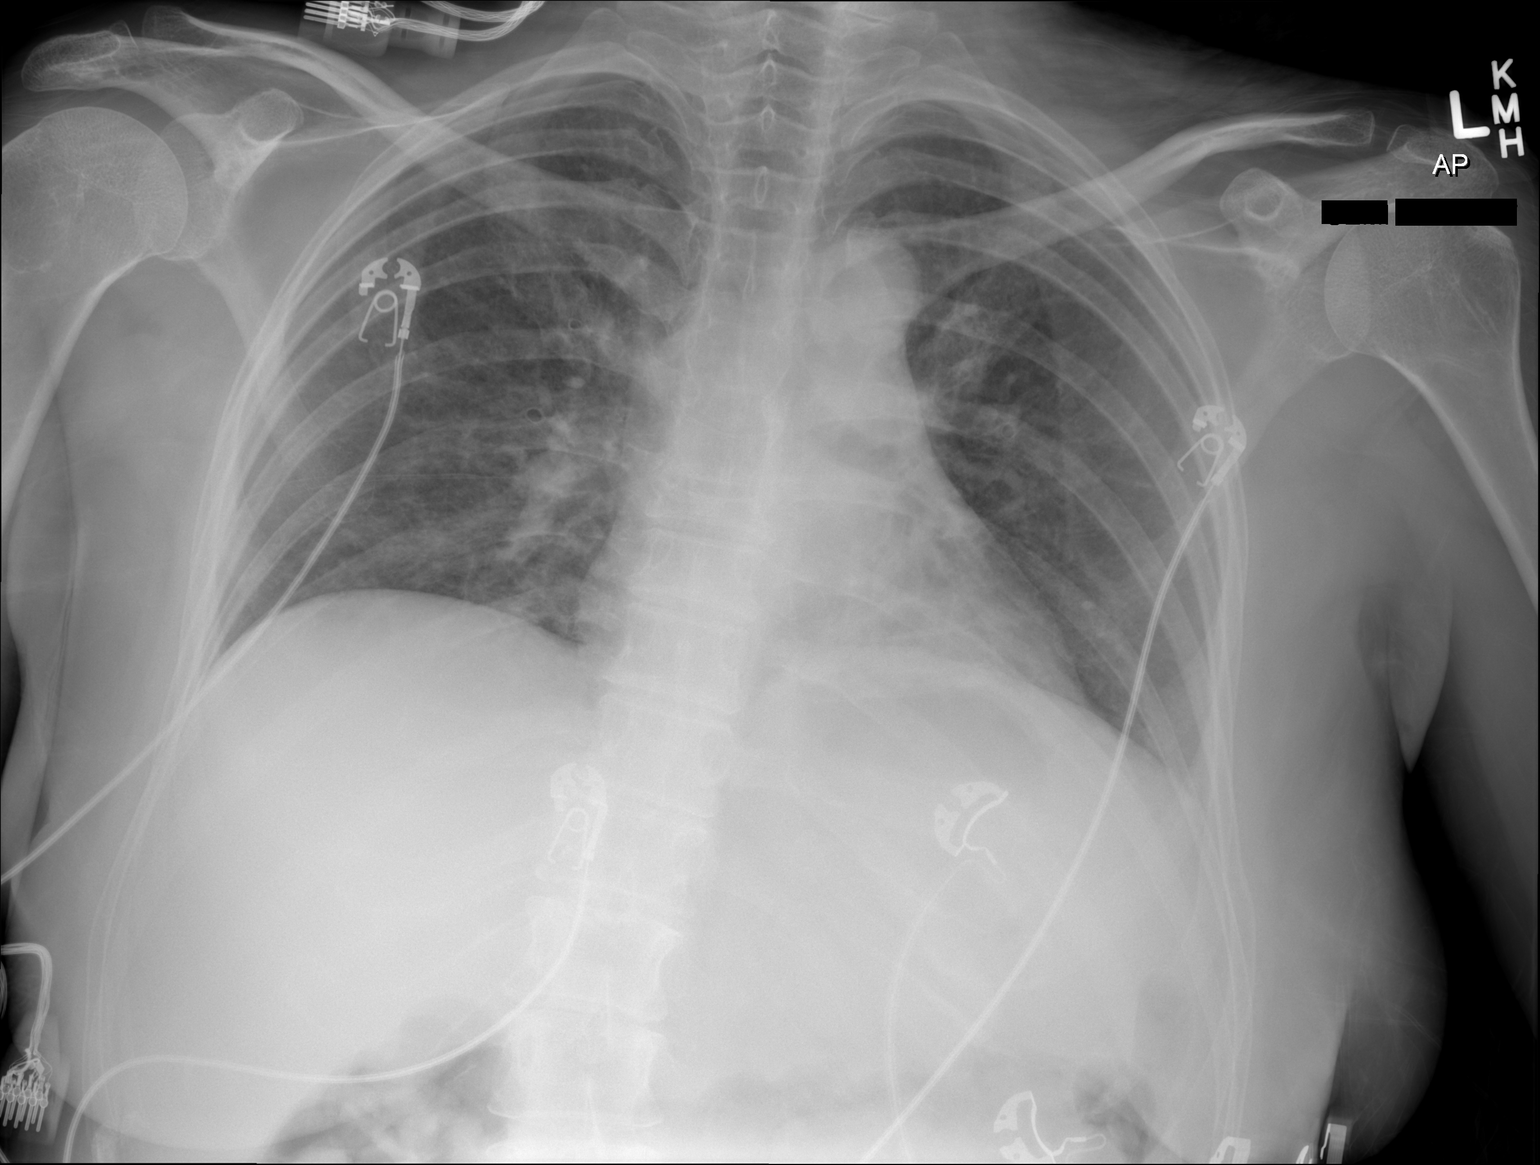

[1 of 1 positions shown; findings below may reference images not displayed]

FINDINGS: Low lung volumes. Mild bibasilar atelectasis. No aspiration
pneumonia is evident. Normal cardiac size for the degree of
inspiration. No osseous findings.
IMPRESSION: Low lung volumes with mild bibasilar atelectasis. No evidence at
this time for aspiration pneumonia.

## 2015-10-12 IMAGING — CR DG ANKLE COMPLETE 3+V*L*
3 series · 3 of 3 positions shown · non-contrast
Comparison: None.

CLINICAL DATA: Leg pain after fall

EXAM:
LEFT ANKLE COMPLETE - 3+ VIEW

[x ankle ap left]
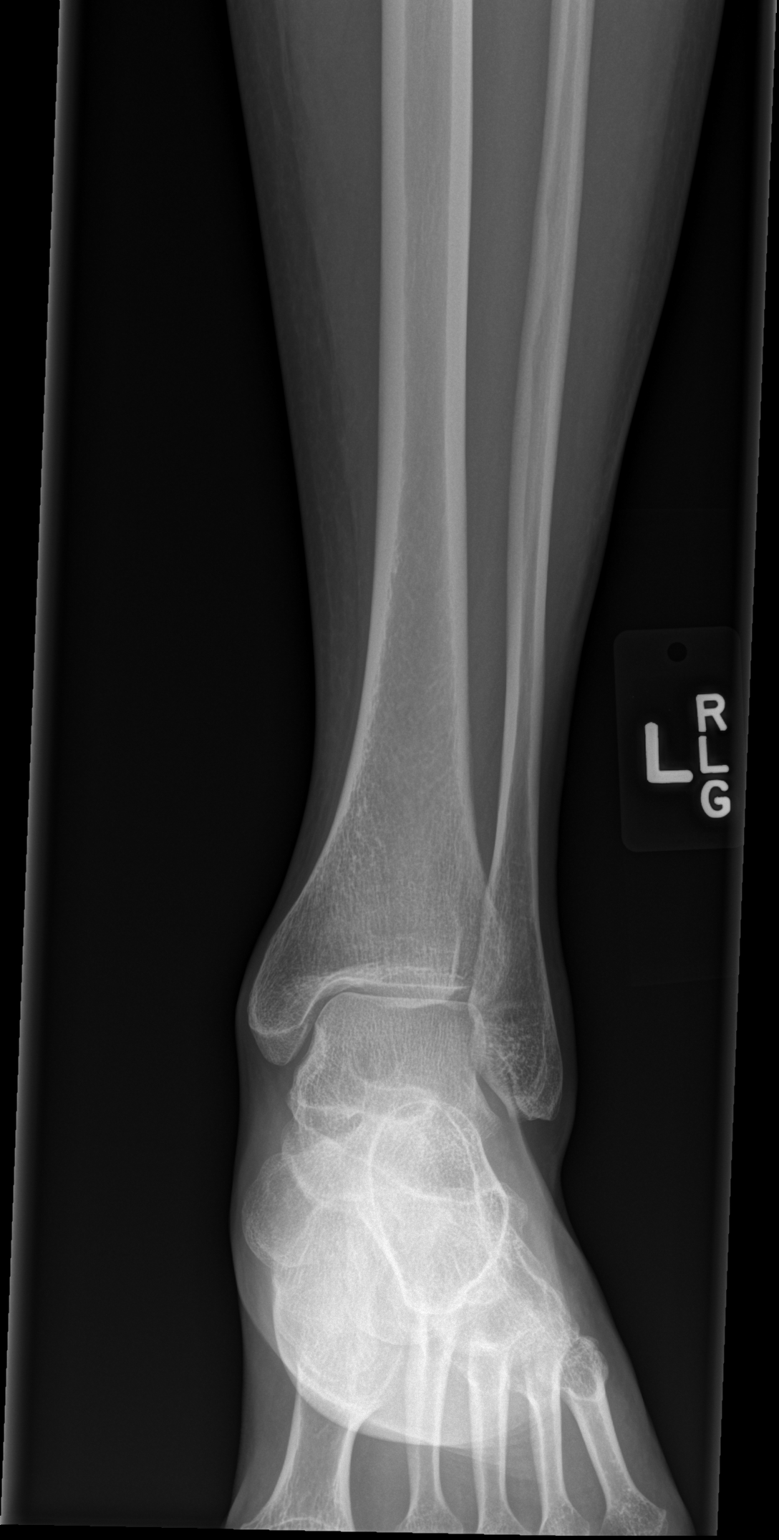

[x ankle obl left]
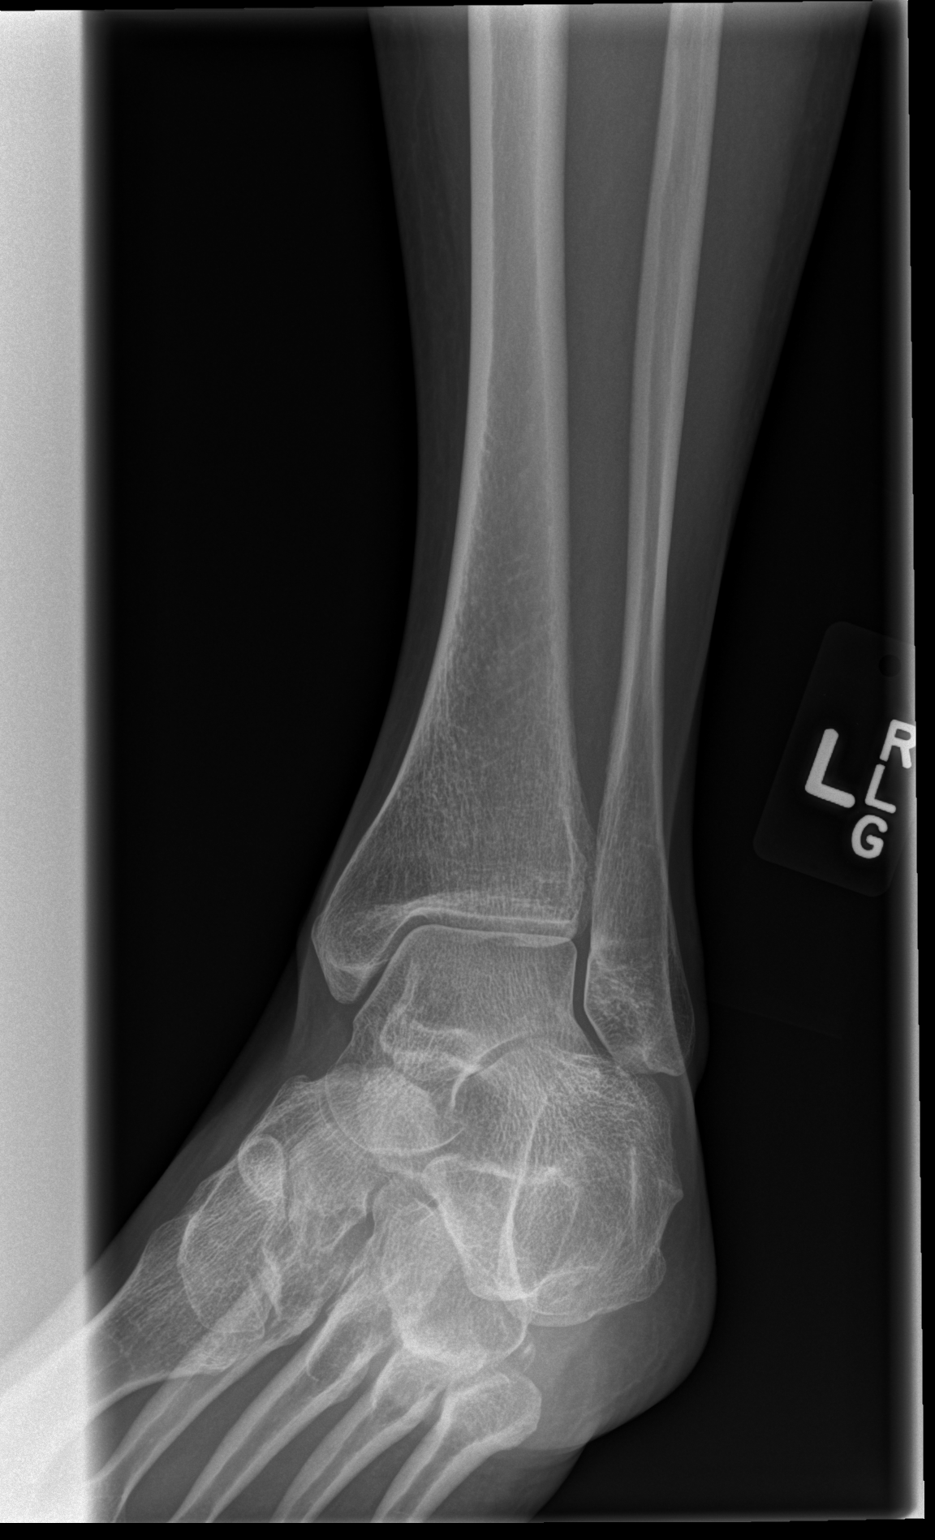

[x ankle lat left]
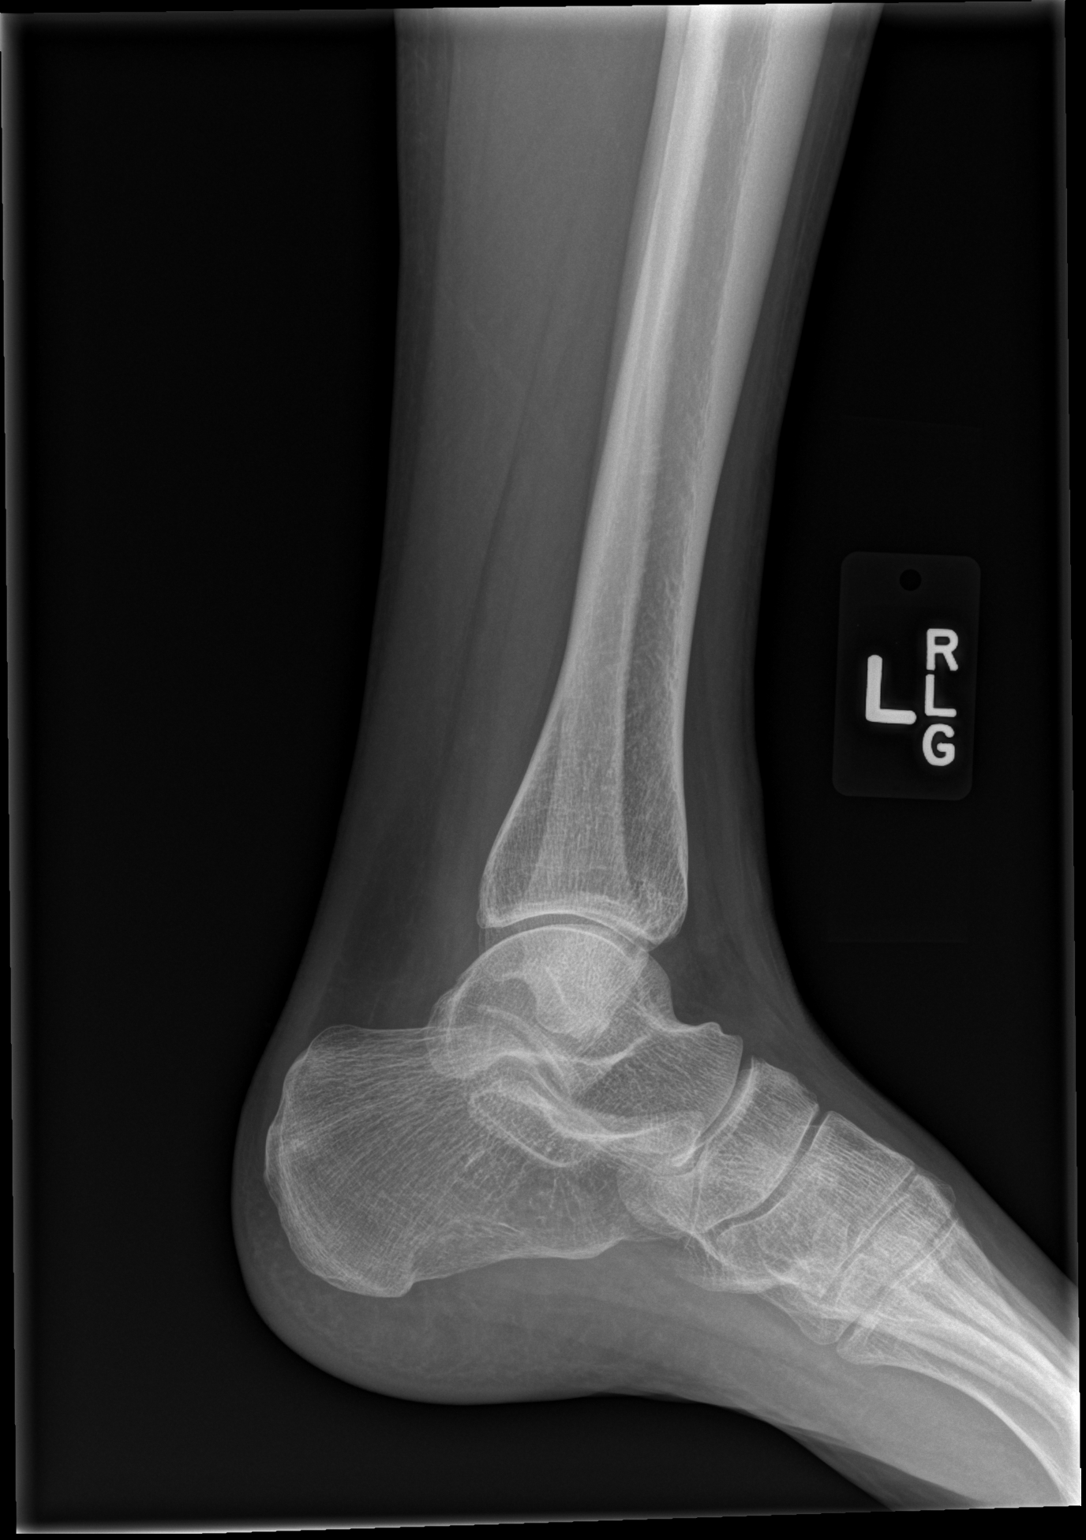

[3 of 3 positions shown; findings below may reference images not displayed]

FINDINGS: There is no evidence of fracture, dislocation, or joint effusion. No
joint narrowing.
IMPRESSION: Negative.

## 2017-02-09 ENCOUNTER — Emergency Department
Admission: EM | Admit: 2017-02-09 | Discharge: 2017-02-09 | Disposition: A | Discharge status: Home / Self Care | Payer: Medicaid Other | Source: Home / Self Care | Attending: Family Medicine | Admitting: Family Medicine

## 2017-02-09 ENCOUNTER — Encounter: Payer: Self-pay | Admitting: *Deleted

## 2017-02-09 DIAGNOSIS — H6093 Unspecified otitis externa, bilateral: Secondary | ICD-10-CM

## 2017-02-09 HISTORY — DX: Anxiety disorder, unspecified: F41.9

## 2017-02-09 HISTORY — DX: Personal history of other diseases of the musculoskeletal system and connective tissue: Z87.39

## 2017-02-09 HISTORY — DX: Essential (primary) hypertension: I10

## 2017-02-09 MED ORDER — NEOMYCIN-POLYMYXIN-HC 3.5-10000-1 OT SUSP: 4.0000 [drp] | Freq: Three times a day (TID) | OTIC | 0 refills | Status: DC

## 2017-02-09 NOTE — ED Provider Notes (Signed)
[facility]    CSN: [id number] Arrival date & time: 02/09/17  1430     History   Chief Complaint Chief Complaint  Patient presents with  . Otalgia    HPI Alicia Hayes is a 41 y.o. female.   Patient reports that she had a URI about one week ago, now resolved.  Last night she noticed some blood in her ear canals.  No known trauma to ears. She also reports that she has a history of anxiety, but has not been on her usual meds while in jail for the past 6 months.   The history is provided by the patient and a parent.  Otalgia  Location:  Bilateral Behind ear:  No abnormality Quality:  Dull Severity:  Mild Onset quality:  Gradual Duration:  2 days Timing:  Constant Progression:  Unchanged Chronicity:  New Context: recent URI and water in ear   Context: not direct blow, not elevation change, not foreign body in ear and not loud noise   Relieved by:  None tried Worsened by:  Nothing Ineffective treatments:  None tried Associated symptoms: congestion, ear discharge, hearing loss and rhinorrhea   Associated symptoms: no cough, no fever, no headaches, no neck pain, no rash, no sore throat, no tinnitus and no vomiting     Past Medical History:  Diagnosis Date  . Anxiety   . Back ache   . DDD (degenerative disc disease)   . H/O degenerative disc disease   . Hepatitis   . HIV (human immunodeficiency virus infection) (HCC)   . Hypertension   . Pancreatitis   . Seizures (HCC)   . Suicide attempt [facility])     Patient Active Problem List   Diagnosis Date Noted  . Polysubstance (including opioids) dependence with physiological dependence (HCC) 01/01/2015  . Opioid dependence (HCC) 05/04/2013  . Benzodiazepine dependence (HCC) 05/04/2013  . Alcohol abuse 05/04/2013  . Substance induced mood disorder 05/03/2013  . Polysubstance overdose 05/01/2013  . HIV disease (HCC) 05/01/2013  . Altered mental status 05/01/2013  . Seizure (HCC) 05/01/2013  . Narcotic  abuse 05/01/2013    Past Surgical History:  Procedure Laterality Date  . BACK SURGERY      OB History    Gravida Para Term Preterm AB Living   SAB TAB Ectopic Multiple Live Births                   Home Medications    Prior to Admission medications   Medication Sig Start Date End Date Taking? Authorizing Provider  lisinopril (PRINIVIL,ZESTRIL) 10 MG tablet Take 10 mg by mouth daily.   Yes [provider]  neomycin-polymyxin-hydrocortisone (CORTISPORIN) 3.5-10000-1 OTIC suspension Place 4 drops into the right ear 3 (three) times daily. 02/09/17   Lattie Haw, MD    Family History Family History  Problem Relation Age of Onset  . Hypertension Father   . Hypertension Paternal Grandmother   . Hypertension Paternal Grandfather     Social History Social History  Substance Use Topics  . Smoking status: Current Every Day Smoker    Packs/day: 0.50    Types: Cigarettes  . Smokeless tobacco: Never Used  . Alcohol use Yes     Comment: occasional     Allergies   Sulfa antibiotics   Review of Systems Review of Systems  Constitutional: Negative for fever.  HENT: Positive for congestion, ear discharge, ear pain, hearing loss and rhinorrhea. Negative  for sore throat and tinnitus.   Respiratory: Negative for cough.   Gastrointestinal: Negative for vomiting.  Musculoskeletal: Negative for neck pain.  Skin: Negative for rash.  Neurological: Negative for headaches.  All other systems reviewed and are negative.    Physical Exam Triage Vital Signs ED Triage Vitals [02/09/17 1510]  Enc Vitals Group     BP 119/80     Pulse Rate 80     Resp 16     Temp 98.4 F (36.9 C)     Temp Source Oral     SpO2 100 %     Weight 141 lb (64 kg)     Height  (1.676 m)     Head Circumference      Peak Flow      Pain Score 4     Pain Loc      Pain Edu?      Excl. in GC?    No data found.   Updated Vital Signs BP 119/80 (BP Location: Left Arm)    Pulse 80   Temp 98.4 F (36.9 C) (Oral)   Resp 16   Ht  (1.676 m)   Wt 141 lb (64 kg)   LMP 02/09/2017   SpO2 100%   Breastfeeding? Unknown   BMI 22.76 kg/m   Visual Acuity Right Eye Distance:   Left Eye Distance:   Bilateral Distance:    Right Eye Near:   Left Eye Near:    Bilateral Near:     Physical Exam Nursing notes and Vital Signs reviewed. Appearance:  Patient appears stated age, and in no acute distress Eyes:  Pupils are equal, round, and reactive to light and accomodation.  Extraocular movement is intact.  Conjunctivae are not inflamed  Ears:  Canals have blood present distally.  Tympanic membranes normal.  Nose:  Mildly congested turbinates.  No sinus tenderness.   Pharynx:  Normal Neck:  Supple.  No adenopathy. Lungs:  Normal respiratory rate. Heart: Normal rate.      UC Treatments / Results  Labs (all labs ordered are listed, but only abnormal results are displayed) Labs Reviewed -   Tympanometry:  Right ear tympanogram normal; Left ear tympanogram normal  EKG  EKG Interpretation None       Radiology No results found.  Procedures Procedures (including critical care time)  Medications Ordered in UC Medications - No data to display   Initial Impression / Assessment and Plan / UC Course  I have reviewed the triage vital signs and the nursing notes.  Pertinent labs & imaging results that were available during my care of the patient were reviewed by me and considered in my medical decision making (see chart for details).    Begin Cortisporin Otic suspension. Use ear drops for approximately one week. Followup with ENT if not improved one week.  Followup with behavioral health provider for anxiety.    Final Clinical Impressions(s) / UC Diagnoses   Final diagnoses:  Otitis externa of both ears, unspecified chronicity, unspecified type    New Prescriptions New Prescriptions   NEOMYCIN-POLYMYXIN-HYDROCORTISONE (CORTISPORIN)  3.5-10000-1 OTIC SUSPENSION    Place 4 drops into the right ear 3 (three) times daily.         Lattie Haw, MD 02/09/17 [phone]

## 2017-02-09 NOTE — Discharge Instructions (Signed)
Use ear drops for approximately one week.

## 2017-02-09 NOTE — ED Triage Notes (Addendum)
Pt c/o URI s/s x 1 wk with nausea and dizziness x 2 days. She reports blood in her ears and ear pain x last night. She reports a hx of Anxiety. She reports taking Xanax before her recent incarceration and Buspar while incarcerated.

## 2021-12-12 ENCOUNTER — Other Ambulatory Visit (HOSPITAL_COMMUNITY)
Admission: EM | Admit: 2021-12-12 | Discharge: 2021-12-13 | Disposition: A | Discharge status: Home / Self Care | Payer: Medicaid Other | Source: Home / Self Care | Admitting: Behavioral Health

## 2021-12-12 ENCOUNTER — Other Ambulatory Visit: Payer: Self-pay

## 2021-12-12 ENCOUNTER — Emergency Department (HOSPITAL_COMMUNITY): Payer: Medicaid Other

## 2021-12-12 ENCOUNTER — Emergency Department (HOSPITAL_COMMUNITY)
Admission: EM | Admit: 2021-12-12 | Discharge: 2021-12-12 | Disposition: A | Discharge status: Other Acute Inpatient Hospital | Payer: Medicaid Other | Attending: Emergency Medicine | Admitting: Emergency Medicine

## 2021-12-12 ENCOUNTER — Encounter (HOSPITAL_COMMUNITY): Payer: Self-pay | Admitting: Emergency Medicine

## 2021-12-12 DIAGNOSIS — N9489 Other specified conditions associated with female genital organs and menstrual cycle: Secondary | ICD-10-CM | POA: Insufficient documentation

## 2021-12-12 DIAGNOSIS — F1510 Other stimulant abuse, uncomplicated: Secondary | ICD-10-CM | POA: Insufficient documentation

## 2021-12-12 DIAGNOSIS — R443 Hallucinations, unspecified: Secondary | ICD-10-CM | POA: Diagnosis not present

## 2021-12-12 DIAGNOSIS — R410 Disorientation, unspecified: Secondary | ICD-10-CM

## 2021-12-12 DIAGNOSIS — F1594 Other stimulant use, unspecified with stimulant-induced mood disorder: Secondary | ICD-10-CM | POA: Diagnosis not present

## 2021-12-12 DIAGNOSIS — Z20822 Contact with and (suspected) exposure to covid-19: Secondary | ICD-10-CM | POA: Diagnosis not present

## 2021-12-12 DIAGNOSIS — F1994 Other psychoactive substance use, unspecified with psychoactive substance-induced mood disorder: Secondary | ICD-10-CM | POA: Diagnosis not present

## 2021-12-12 DIAGNOSIS — R4182 Altered mental status, unspecified: Secondary | ICD-10-CM | POA: Diagnosis present

## 2021-12-12 LAB — COMPREHENSIVE METABOLIC PANEL
ALT: 17 U/L (ref 0–44)
AST: 29 U/L (ref 15–41)
Albumin: 4 g/dL (ref 3.5–5.0)
Alkaline Phosphatase: 89 U/L (ref 38–126)
Anion gap: 11 (ref 5–15)
BUN: 25 mg/dL — ABNORMAL HIGH (ref 6–20)
CO2: 22 mmol/L (ref 22–32)
Calcium: 9.2 mg/dL (ref 8.9–10.3)
Chloride: 107 mmol/L (ref 98–111)
Creatinine, Ser: 0.67 mg/dL (ref 0.44–1.00)
GFR, Estimated: >60 mL/min (ref >60)
Glucose, Bld: 103 mg/dL — ABNORMAL HIGH (ref 70–99)
Potassium: 3.6 mmol/L (ref 3.5–5.1)
Sodium: 140 mmol/L (ref 135–145)
Total Bilirubin: 1 mg/dL (ref 0.3–1.2)
Total Protein: 7.8 g/dL (ref 6.5–8.1)

## 2021-12-12 LAB — I-STAT BETA HCG BLOOD, ED (MC, WL, AP ONLY): I-stat hCG, quantitative: <5 m[IU]/mL (ref <5)

## 2021-12-12 LAB — RAPID URINE DRUG SCREEN, HOSP PERFORMED
Amphetamines: POSITIVE — AB
Barbiturates: NOT DETECTED
Benzodiazepines: NOT DETECTED
Cocaine: NOT DETECTED
Opiates: NOT DETECTED
Tetrahydrocannabinol: NOT DETECTED

## 2021-12-12 LAB — CBC WITH DIFFERENTIAL/PLATELET
Abs Immature Granulocytes: 0.03 10*3/uL (ref 0.00–0.07)
Basophils Absolute: 0 10*3/uL (ref 0.0–0.1)
Basophils Relative: 1 %
Eosinophils Absolute: 0.1 10*3/uL (ref 0.0–0.5)
Eosinophils Relative: 1 %
HCT: 32.8 % — ABNORMAL LOW (ref 36.0–46.0)
Hemoglobin: 10.8 g/dL — ABNORMAL LOW (ref 12.0–15.0)
Immature Granulocytes: 0 %
Lymphocytes Relative: 20 %
Lymphs Abs: 1.6 10*3/uL (ref 0.7–4.0)
MCH: 29.1 pg (ref 26.0–34.0)
MCHC: 32.9 g/dL (ref 30.0–36.0)
MCV: 88.4 fL (ref 80.0–100.0)
Monocytes Absolute: 0.5 10*3/uL (ref 0.1–1.0)
Monocytes Relative: 7 %
Neutro Abs: 5.7 10*3/uL (ref 1.7–7.7)
Neutrophils Relative %: 71 %
Platelets: 274 10*3/uL (ref 150–400)
RBC: 3.71 MIL/uL — ABNORMAL LOW (ref 3.87–5.11)
RDW: 15.4 % (ref 11.5–15.5)
WBC: 8 10*3/uL (ref 4.0–10.5)
nRBC: 0 % (ref 0.0–0.2)

## 2021-12-12 LAB — RESP PANEL BY RT-PCR (FLU A&B, COVID) ARPGX2
Influenza A by PCR: NEGATIVE
Influenza B by PCR: NEGATIVE
SARS Coronavirus 2 by RT PCR: NEGATIVE

## 2021-12-12 LAB — URINALYSIS, ROUTINE W REFLEX MICROSCOPIC
Bacteria, UA: NONE SEEN
Bilirubin Urine: NEGATIVE
Glucose, UA: NEGATIVE mg/dL
Hgb urine dipstick: NEGATIVE
Ketones, ur: 80 mg/dL — AB
Leukocytes,Ua: NEGATIVE
Nitrite: NEGATIVE
Protein, ur: 30 mg/dL — AB
Specific Gravity, Urine: 1.029 (ref 1.005–1.030)
pH: 5 (ref 5.0–8.0)

## 2021-12-12 LAB — ETHANOL: Alcohol, Ethyl (B): <10 mg/dL (ref <10)

## 2021-12-12 MED ORDER — ACETAMINOPHEN 325 MG PO TABS: 650.0000 mg | ORAL_TABLET | Freq: Four times a day (QID) | ORAL | Status: DC | PRN

## 2021-12-12 MED ORDER — MAGNESIUM HYDROXIDE 400 MG/5ML PO SUSP: 30.0000 mL | Freq: Every day | ORAL | Status: DC | PRN

## 2021-12-12 MED ORDER — ALUM & MAG HYDROXIDE-SIMETH 200-200-20 MG/5ML PO SUSP: 30.0000 mL | ORAL | Status: DC | PRN

## 2021-12-12 MED ORDER — OLANZAPINE 5 MG PO TABS: 5.0000 mg | ORAL_TABLET | Freq: Every day | ORAL | Status: DC

## 2021-12-12 MED ORDER — TRAZODONE HCL 50 MG PO TABS: 50.0000 mg | ORAL_TABLET | Freq: Every evening | ORAL | Status: DC | PRN

## 2021-12-12 MED ORDER — HYDROXYZINE HCL 25 MG PO TABS
25.0000 mg | ORAL_TABLET | Freq: Three times a day (TID) | ORAL | Status: DC | PRN
  Filled 2021-12-12: qty 1

## 2021-12-12 MED ORDER — SODIUM CHLORIDE 0.9 % IV BOLUS
1000.0000 mL | Freq: Once | INTRAVENOUS | Status: AC
  Administered 2021-12-12: 1000 mL via INTRAVENOUS
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
## 2021-12-12 NOTE — Consult Note (Addendum)
[facility] ED ASSESSMENT   Reason for Consult:  Altered Mental Status Referring Physician:  Virgina Norfolk  Patient Identification: Alicia Hayes MRN:  [id number] ED Chief Complaint: Substance induced mood disorder (HCC)  Diagnosis:  Principal Problem:   Substance induced mood disorder [facility])   ED Assessment Time Calculation: Start Time: 1335 Stop Time: 1355 Total Time in Minutes (Assessment Completion): 20   HPI:   Alicia Hayes is a 46 y.o. female presenting for AMS by EMS after she was found unresponsive on the side of the road. Pt has a PMHx of polysubstance use disorder, HIV, hep C, IV drug use, borderline personality disorder, recurrent UTIs, osteomyelitis, and endocarditis. Pt responds with "Ask God" to the majority of our assessment questions. She appears to be religiously preoccupied. She denies any recent use of illicit drug or alcohol use. She does report taking suboxone this morning. She is unable to share the events that brought her to the hospital and instead says "Ask God." Asked pt if there is any information that she can share and she said "cuz she can't tell me." When asked who "she" referred to, once again she said "Ask God." Does report a previous suicide attempt and prior psychiatric hospitalizations.    Upon chart review, it is noted that she has had 7 ED visits this year. Pt was at the ED at [facility] yesterday (12/11/2021). She was brought in because she was "wandering around town" and she reported that she was having a headache and was dehydrated. Note from the MD today states the the patient had eloped after going through their "First look process." She was seen in the ED on 12/02/2021 for a chief complaint of auditory hallucinations in the setting of her recent IV methamphetamine use. She was discharged home with a follow-up with [facility] the next day. She is seeing infectious disease on an outpatient basis. On 10/15/2021 she was in the ED for psychosis and her  UDS was positive for meth, fentanyl, and benzo at that time.    On evaluation patient is lying in bed. She is disheveled and poorly groomed. She is alert. Eye contact is minimal. Patient primarily responds "ask God." She does provide some yes or no responses. She denies current SI. She acknowledges a history of suicide attempt. When asked about substance use she states that she only takes suboxone. States that she last took it this morning. Denies overdosing on the suboxone. Denies auditory and visual hallucinations. No indication that patient is responding to internal stimuli. Denies homicidal ideations. UDS positive for amphetamines. BAL <10.   Past Psychiatric History:Opioid use disorder. Substance induced psychosis. On chart review, it is noted that the patient has been previously prescribed abilify for AH. Last hospitalization appears to have been from 09/18/2019 to 09/26/2019 at [facility] for AMS.  Risk to Self or Others: Is the patient at risk to self? Yes Has the patient been a risk to self in the past 6 months? Yes Has the patient been a risk to self within the distant past? Yes Is the patient a risk to others? No Has the patient been a risk to others in the past 6 months? No Has the patient been a risk to others within the distant past? No  [place] Scale:   AIMS:  , , ,  ,   ASAM:    Substance Abuse:  Alcohol / Drug Use History of alcohol / drug use?: Yes Longest period of sobriety (when/how long): 12/12/2021--patient does not  provide any information regarding substance use. Denies use, but chart review indicates recent use of various substances.  Past Medical History:  Past Medical History:  Diagnosis Date   Anxiety    Back ache    DDD (degenerative disc disease)    H/O degenerative disc disease    Hepatitis    HIV (human immunodeficiency virus infection) (HCC)    Hypertension    Pancreatitis    Seizures (HCC)    Suicide attempt [facility])     Past Surgical  History:  Procedure Laterality Date   BACK SURGERY     Family History:  Family History  Problem Relation Age of Onset   Hypertension Father    Hypertension Paternal Grandmother    Hypertension Paternal Grandfather    Family Psychiatric  History:  Social History:  Social History   Substance and Sexual Activity  Alcohol Use Yes   Comment: occasional     Social History   Substance and Sexual Activity  Drug Use No   Types: Oxycodone   Comment: hx of opiate, herion, cocaine use    Social History   Socioeconomic History   Marital status: Divorced    Spouse name: Not on file   Number of children: Not on file   Years of education: Not on file   Highest education level: Not on file  Occupational History   Not on file  Tobacco Use   Smoking status: Every Day    Packs/day: 0.50    Types: Cigarettes   Smokeless tobacco: Never  Vaping Use   Vaping Use: Never used  Substance and Sexual Activity   Alcohol use: Yes    Comment: occasional   Drug use: No    Types: Oxycodone    Comment: hx of opiate, herion, cocaine use   Sexual activity: Yes    Birth control/protection: None  Other Topics Concern   Not on file  Social History Narrative   Not on file   Social Determinants of Health   Financial Resource Strain: Not on file  Food Insecurity: Not on file  Transportation Needs: Not on file  Physical Activity: Not on file  Stress: Not on file  Social Connections: Not on file   Additional Social History:    Allergies:   Allergies  Allergen Reactions   Sulfa Antibiotics Hives and Itching    Labs:  Results for orders placed or performed during the hospital encounter of 12/12/21 (from the past 48 hour(s))  CBC with Differential     Status: Abnormal   Collection Time: 12/12/21 10:50 AM  Result Value Ref Range   WBC 8.0 4.0 - 10.5 K/uL   RBC 3.71 (L) 3.87 - 5.11 MIL/uL   Hemoglobin 10.8 (L) 12.0 - 15.0 g/dL   HCT 79.8 (L) 92.1 - 19.4 %   MCV 88.4 80.0 - 100.0 fL    MCH 29.1 26.0 - 34.0 pg   MCHC 32.9 30.0 - 36.0 g/dL   RDW 17.4 08.1 - 44.8 %   Platelets 274 150 - 400 K/uL   nRBC 0.0 0.0 - 0.2 %   Neutrophils Relative % 71 %   Neutro Abs 5.7 1.7 - 7.7 K/uL   Lymphocytes Relative 20 %   Lymphs Abs 1.6 0.7 - 4.0 K/uL   Monocytes Relative 7 %   Monocytes Absolute 0.5 0.1 - 1.0 K/uL   Eosinophils Relative 1 %   Eosinophils Absolute 0.1 0.0 - 0.5 K/uL   Basophils Relative 1 %   Basophils Absolute  0.0 0.0 - 0.1 K/uL   Immature Granulocytes 0 %   Abs Immature Granulocytes 0.03 0.00 - 0.07 K/uL    Comment: Performed at [facility], [address], [place], [place] [address]  Comprehensive metabolic panel     Status: Abnormal   Collection Time: 12/12/21 10:50 AM  Result Value Ref Range   Sodium 140 135 - 145 mmol/L   Potassium 3.6 3.5 - 5.1 mmol/L   Chloride 107 98 - 111 mmol/L   CO2 22 22 - 32 mmol/L   Glucose, Bld 103 (H) 70 - 99 mg/dL    Comment: Glucose reference range applies only to samples taken after fasting for at least 8 hours.   BUN 25 (H) 6 - 20 mg/dL   Creatinine, Ser 4.27 0.44 - 1.00 mg/dL   Calcium 9.2 8.9 - 06.2 mg/dL   Total Protein 7.8 6.5 - 8.1 g/dL   Albumin 4.0 3.5 - 5.0 g/dL   AST 29 15 - 41 U/L   ALT 17 0 - 44 U/L   Alkaline Phosphatase 89 38 - 126 U/L   Total Bilirubin 1.0 0.3 - 1.2 mg/dL   GFR, Estimated >37 >62 mL/min    Comment: (NOTE) Calculated using the CKD-EPI Creatinine Equation (2021)    Anion gap 11 5 - 15    Comment: Performed at [facility], [address], [place], [place] [address]  Ethanol     Status: None   Collection Time: 12/12/21 10:50 AM  Result Value Ref Range   Alcohol, Ethyl (B) <10 <10 mg/dL    Comment: (NOTE) Lowest detectable limit for serum alcohol is 10 mg/dL.  For medical purposes only. Performed at [facility], [address], [place], [place] [address]   I-Stat Beta hCG blood, ED ([facility], [facility], [facility] only)     Status: None    Collection Time: 12/12/21 10:56 AM  Result Value Ref Range   I-stat hCG, quantitative <5.0 <5 mIU/mL   Comment 3            Comment:   GEST. AGE      CONC.  (mIU/mL)   <=1 WEEK        5 - 50     2 WEEKS       50 - 500     3 WEEKS       100 - 10,000     4 WEEKS     1,000 - 30,000        FEMALE AND NON-PREGNANT FEMALE:     LESS THAN 5 mIU/mL     Current Facility-Administered Medications  Medication Dose Route Frequency Provider Last Rate Last Admin   OLANZapine (ZYPREXA) tablet 5 mg  5 mg Oral QHS Nira Conn A, NP       Current Outpatient Medications  Medication Sig Dispense Refill   amitriptyline (ELAVIL) 50 MG tablet Take 50 mg by mouth daily.     ARIPiprazole (ABILIFY) 5 MG tablet Take 5 mg by mouth at bedtime.     Buprenorphine HCl-Naloxone HCl 8-2 MG FILM Place 1 Film under the tongue 3 (three) times daily. X 14 days     gabapentin (NEURONTIN) 600 MG tablet Take 600 mg by mouth 3 (three) times daily.     promethazine (PHENERGAN) 25 MG tablet Take 25 mg by mouth every 8 (eight) hours as needed for nausea.     senna-docusate (SENOKOT-S) 8.6-50 MG tablet Take 1 tablet by mouth 2 (two)  times daily as needed for constipation. X 30 days     tiZANidine (ZANAFLEX) 2 MG tablet Take 2 mg by mouth every 8 (eight) hours as needed for muscle spasms.     lisinopril (PRINIVIL,ZESTRIL) 10 MG tablet Take 10 mg by mouth daily.       Psychiatric Specialty Exam: Presentation  General Appearance: Fairly Groomed  Eye Contact:Minimal  Speech:Garbled  Speech Volume:Decreased  Handedness:No data recorded  Mood and Affect  Mood:No data recorded Affect:No data recorded  Thought Process  Thought Processes:Other (comment) (UTA patient mostly responds "ask God." She does provide a few yes and no responses)  Descriptions of Associations:-- (UTA patient mostly responds "ask God." She does provide a few yes and no responses)  Orientation:Other (comment) (UTA patient mostly responds "ask God."  She does provide a few yes and no responses)  Thought Content:No data recorded History of Schizophrenia/Schizoaffective disorder:No data recorded Duration of Psychotic Symptoms:No data recorded Hallucinations:No data recorded Ideas of Reference:No data recorded Suicidal Thoughts:No data recorded Homicidal Thoughts:No data recorded  Sensorium  Memory:No data recorded Judgment:No data recorded Insight:No data recorded  Executive Functions  Concentration:No data recorded Attention Span:No data recorded Recall:No data recorded Fund of Knowledge:No data recorded Language:Poor   Psychomotor Activity  Psychomotor Activity:No data recorded  Assets  Assets:No data recorded   Sleep  Sleep:No data recorded  Physical Exam: Physical Exam Constitutional:      General: She is not in acute distress.    Appearance: She is not ill-appearing, toxic-appearing or diaphoretic.  HENT:     Right Ear: External ear normal.     Left Ear: External ear normal.  Eyes:     General:        Right eye: No discharge.        Left eye: No discharge.  Cardiovascular:     Rate and Rhythm: Tachycardia present.  Pulmonary:     Effort: Pulmonary effort is normal. No respiratory distress.  Skin:    General: Skin is warm and dry.  Neurological:     Mental Status: She is alert.    Review of Systems  Unable to perform ROS: Psychiatric disorder    Blood pressure (!) 134/93, pulse (!) 106, temperature 97.8 F (36.6 C), temperature source Oral, resp. rate 12, height 5\' 6"  (1.676 m), weight 63.5 kg, SpO2 99 %, unknown if currently breastfeeding. Body mass index is 22.6 kg/m.  Medical Decision Making: Patient appears to be intoxicated at this time and provides limited assessment information. Recommend overnight observation in the ED with reassessment in the morning. Will reassess later today to determine if patient is appropriate for transfer to [facility].  Start olanzapine 5 mg QHS for amphetamine induced  mood disorder  Problem 1: Opioid use disorder  Problem 2: Amphetamine induced mood disorder    Disposition:  Patient appears to be intoxicated at this time and provides limited assessment information. Recommend overnight observation in the ED with reassessment in the morning. Will reassess later today to determine if patient is appropriate for transfer to [facility].  [facility], NP 12/12/2021 3:47 PM

## 2021-12-12 NOTE — Progress Notes (Signed)
Alicia Hayes is a 46 y.o. female presenting for AMS by EMS after she was found unresponsive on the side of the road. Pt has a PMHx of polysubstance use disorder, HIV, hep C, IV drug use, borderline personality disorder, recurrent UTIs, osteomyelitis, and endocarditis. Pt responds with "Ask God" to the majority of our assessment questions. She appears to be religiously preoccupied. She denies any recent use of illicit drug or alcohol use. She does report taking suboxone this morning. She is unable to share the events that brought her to the hospital and instead says "Ask God." Asked pt if there is any information that she can share and she said "cuz she can't tell me." When asked who "she" referred to, once again she said "Ask God." Does report a previous suicide attempt and prior psychiatric hospitalizations.    Upon chart review, it is noted that she has had 7 ED visits this year. Pt was at the ED at [facility] yesterday (12/11/2021). She was brought in because she was "wandering around town" and she reported that she was having a headache and was dehydrated. Note from the MD today states the the patient had eloped after going through their "First look process." She was seen in the ED on 12/02/2021 for a chief complaint of auditory hallucinations in the setting of her recent IV methamphetamine use. She was discharged home with a follow-up with [facility] the next day. She is seeing infectious disease on an outpatient basis. On 10/15/2021 she was in the ED for psychosis and her UDS was positive for meth, fentanyl, and benzo at that time.  On reassessment this evening, patient is alert and oriented. She is calm and cooperative. Speech is clear and coherent. Mood is depressed and affect is congruent with mood. Thought process is coherent and thought content is logical. Denies auditory and visual hallucinations. No indication that patient is responding to internal stimuli.  Denies suicidal ideations.  Denies homicidal ideations. Denies substance abuse.  However, UDS positive for amphetamines. Patient is in agreement with transfer to [facility]. She is not interested in substance abuse treatment, other than continuing suboxone.  Patient has been accepted for transfer to [facility] by Doran Heater, NP

## 2021-12-12 NOTE — ED Provider Notes (Signed)
[facility]-EMERGENCY DEPT Provider Note   CSN: [id number] Arrival date & time: 12/12/21  1012     History  Chief Complaint  Patient presents with   Altered Mental Status    Alicia Hayes is a 45 y.o. female.  Is here with EMS after being found on the side of the road.  She had a hospital bracelet on her.  She states that she takes Suboxone.  She denies any pain.  She cannot tell me much details about last night.  Denies any other alcohol or drug use.  She was found fairly sleepy with EMS.  Slurring her words.  There is no signs of trauma.  Patient denies suicidal homicidal ideation but overall level 5 caveat due to altered mental status.  The history is provided by the patient and the EMS personnel.       Home Medications Prior to Admission medications   Medication Sig Start Date End Date Taking? Authorizing Provider  lisinopril (PRINIVIL,ZESTRIL) 10 MG tablet Take 10 mg by mouth daily.    [provider]  neomycin-polymyxin-hydrocortisone (CORTISPORIN) 3.5-10000-1 OTIC suspension Place 4 drops into the right ear 3 (three) times daily. 02/09/17   Lattie Haw, MD      Allergies    Sulfa antibiotics    Review of Systems   Review of Systems  Physical Exam Updated Vital Signs BP (!) 134/93   Pulse (!) 106   Temp 98.1 F (36.7 C)   Resp 12   Ht 5\' 6"  (1.676 m)   Wt 63.5 kg   SpO2 99%   BMI 22.60 kg/m  Physical Exam Vitals and nursing note reviewed.  Constitutional:      General: She is not in acute distress.    Appearance: She is well-developed. She is not ill-appearing.  HENT:     Head: Normocephalic and atraumatic.     Nose: Nose normal.     Mouth/Throat:     Mouth: Mucous membranes are moist.  Eyes:     Extraocular Movements: Extraocular movements intact.     Conjunctiva/sclera: Conjunctivae normal.  Cardiovascular:     Rate and Rhythm: Normal rate and regular rhythm.     Pulses: Normal pulses.     Heart sounds:  Normal heart sounds. No murmur heard. Pulmonary:     Effort: Pulmonary effort is normal. No respiratory distress.     Breath sounds: Normal breath sounds.  Abdominal:     Palpations: Abdomen is soft.     Tenderness: There is no abdominal tenderness.  Musculoskeletal:        General: No swelling.     Cervical back: Neck supple.  Skin:    General: Skin is warm and dry.     Capillary Refill: Capillary refill takes less than 2 seconds.  Neurological:     General: No focal deficit present.     Mental Status: She is alert.     Comments: Patient moves all extremities, follows commands, seems intoxicated, pupils are dilated about 6 mm bilaterally  Psychiatric:        Mood and Affect: Mood normal.     ED Results / Procedures / Treatments   Labs (all labs ordered are listed, but only abnormal results are displayed) Labs Reviewed  CBC WITH DIFFERENTIAL/PLATELET - Abnormal; Notable for the following components:      Result Value   RBC 3.71 (*)    Hemoglobin 10.8 (*)    HCT 32.8 (*)    All other components  within normal limits  COMPREHENSIVE METABOLIC PANEL - Abnormal; Notable for the following components:   Glucose, Bld 103 (*)    BUN 25 (*)    All other components within normal limits  RESP PANEL BY RT-PCR (FLU A&B, COVID) ARPGX2  ETHANOL  RAPID URINE DRUG SCREEN, HOSP PERFORMED  URINALYSIS, ROUTINE W REFLEX MICROSCOPIC  I-STAT BETA HCG BLOOD, ED ([facility], [facility], [facility] ONLY)    EKG None  Radiology CT Head Wo Contrast  Result Date: 12/12/2021 CLINICAL DATA:  Mental status change, found on side of road unresponsive, per EMS patient responsive to voice and oriented only to name EXAM: CT HEAD WITHOUT CONTRAST CT CERVICAL SPINE WITHOUT CONTRAST TECHNIQUE: Multidetector CT imaging of the head and cervical spine was performed following the standard protocol without intravenous contrast. Multiplanar CT image reconstructions of the cervical spine were also generated. RADIATION DOSE REDUCTION: This  exam was performed according to the departmental dose-optimization program which includes automated exposure control, adjustment of the mA and/or kV according to patient size and/or use of iterative reconstruction technique. COMPARISON:  CT head 04/30/2013. FINDINGS: CT HEAD FINDINGS Brain: Normal ventricular morphology. No midline shift or mass effect. Normal appearance of brain parenchyma. No intracranial hemorrhage, mass lesion, evidence of acute infarction, or extra-axial fluid collection. Vascular: No hyperdense vessels Skull: Intact Sinuses/Orbits: Clear Other: N/A CT CERVICAL SPINE FINDINGS Alignment: Normal Skull base and vertebrae: Osseous mineralization normal. Visualized skull base intact. Vertebral body and disc space heights maintained. No fracture, subluxation, or bone destruction. Soft tissues and spinal canal: Prevertebral soft tissues normal thickness. Remaining cervical soft tissues unremarkable. Disc levels:  No specific abnormalities Upper chest: Lung apices clear Other: N/A IMPRESSION: Normal CT head. Normal CT cervical spine. Electronically Signed   By: Ulyses Southward M.D.   On: 12/12/2021 11:26   CT Cervical Spine Wo Contrast  Result Date: 12/12/2021 CLINICAL DATA:  Mental status change, found on side of road unresponsive, per EMS patient responsive to voice and oriented only to name EXAM: CT HEAD WITHOUT CONTRAST CT CERVICAL SPINE WITHOUT CONTRAST TECHNIQUE: Multidetector CT imaging of the head and cervical spine was performed following the standard protocol without intravenous contrast. Multiplanar CT image reconstructions of the cervical spine were also generated. RADIATION DOSE REDUCTION: This exam was performed according to the departmental dose-optimization program which includes automated exposure control, adjustment of the mA and/or kV according to patient size and/or use of iterative reconstruction technique. COMPARISON:  CT head 04/30/2013. FINDINGS: CT HEAD FINDINGS Brain: Normal  ventricular morphology. No midline shift or mass effect. Normal appearance of brain parenchyma. No intracranial hemorrhage, mass lesion, evidence of acute infarction, or extra-axial fluid collection. Vascular: No hyperdense vessels Skull: Intact Sinuses/Orbits: Clear Other: N/A CT CERVICAL SPINE FINDINGS Alignment: Normal Skull base and vertebrae: Osseous mineralization normal. Visualized skull base intact. Vertebral body and disc space heights maintained. No fracture, subluxation, or bone destruction. Soft tissues and spinal canal: Prevertebral soft tissues normal thickness. Remaining cervical soft tissues unremarkable. Disc levels:  No specific abnormalities Upper chest: Lung apices clear Other: N/A IMPRESSION: Normal CT head. Normal CT cervical spine. Electronically Signed   By: Ulyses Southward M.D.   On: 12/12/2021 11:26   DG Chest Portable 1 View  Result Date: 12/12/2021 CLINICAL DATA:  Altered mental status, unresponsive EXAM: PORTABLE CHEST 1 VIEW COMPARISON:  04/30/2013 FINDINGS: Low lung volumes. Cardiac and mediastinal contours are within normal limits. No focal pulmonary opacity. No pleural effusion or pneumothorax. No acute osseous abnormality. IMPRESSION: No acute cardiopulmonary  process. Electronically Signed   By: Wiliam Ke M.D.   On: 12/12/2021 11:19    Procedures Procedures    Medications Ordered in ED Medications  sodium chloride 0.9 % bolus 1,000 mL (1,000 mLs Intravenous New Bag/Given 12/12/21 1059)    ED Course/ Medical Decision Making/ A&P                           Medical Decision Making Amount and/or Complexity of Data Reviewed Labs: ordered. Radiology: ordered.   Alicia Hayes is here with altered mental status.  Brought in by EMS found walking in the streets.  She had a hospital bracelet on her.  Upon chart review it looks like she was at [facility] last night.  Seems like she is admitted to methamphetamine use per their note.  Recent drug screens have been  positive for marijuana, methamphetamines.  She is on Suboxone.  She denies any alcohol or drug use with me except for taking her Suboxone.  She neurologically appears to be okay but does seem intoxicated.  She is slurring her words.  She denies any trauma.  Differential diagnosis is this is likely from polysubstance abuse.  Suspect she is hallucinating, having difficulty secondary to methamphetamine use.  We will get lab work including CBC, CMP, UDS, CT head and neck, chest x-ray to evaluate for electrolyte abnormality, dehydration.  She does not admit to any SI or HI.  Will give IV fluids and reevaluate.  Per my review and interpretation of labs there is no significant anemia, electrolyte abnormality, kidney injury.  Overall suspect some psychosis likely secondary to methamphetamine.  On reevaluation she is talking about talking to God and having some hallucinations.  We will have her evaluated by TTS.  We will IVC.  She has been to the ED several times in the last 2 days and wandering in the street and not making much sense.  This chart was dictated using voice recognition software.  Despite best efforts to proofread,  errors can occur which can change the documentation meaning.         Final Clinical Impression(s) / ED Diagnoses Final diagnoses:  Hallucinations  Methamphetamine use [facility])    Rx / DC Orders ED Discharge Orders     None         Virgina Norfolk, DO 12/12/21 1312

## 2021-12-12 NOTE — ED Provider Notes (Signed)
[facility] Continuous [facility] Admission H&P  Date: 12/12/21 Patient Name: Alicia Hayes MRN: [id number] Chief Complaint: No chief complaint on file.     Diagnoses:  Final diagnoses:  None    HPI: Alicia Hayes is a 46 y/o female with history of  polysubstance use disorder, HIV, hep C, IV drug use, borderline personality disorder. Patient [facility] for continuous assessment. Patient is under IVC  Patient was seen face to face upon her arrival [facility]. She is drowsy and oriented X3. She is minimally cooperative; she says "I'm tired and I just want to sleep." Patient's speech is clear, she has fair eye contact. Patient's mood is depressed with congruent affect. She did not appear to be responding to any internal/external stimuli. She continues to deny SI, HI, AVH, and paranoia. She says she uses methamphetamine daily. She last smoked meth on 12/11/21.    PHQ 2-9:     Total Time spent with patient: 15 minutes  Musculoskeletal  Strength & Muscle Tone: within normal limits Gait & Station: normal Patient leans: Right  Psychiatric Specialty Exam  Presentation General Appearance: Fairly Groomed  Eye Contact:Minimal  Speech:Garbled  Speech Volume:Normal  Handedness:Right   Mood and Affect  Mood:Depressed  Affect:Congruent   Thought Process  Thought Processes:Coherent  Descriptions of Associations:Intact  Orientation:Full (Time, Place and Person)  Thought Content:Logical    Hallucinations:Hallucinations: None  Ideas of Reference:None  Suicidal Thoughts:Suicidal Thoughts: No  Homicidal Thoughts:Homicidal Thoughts: No   Sensorium  Memory:Immediate Fair; Recent Fair; Remote Poor  Judgment:Fair  Insight:Poor   Executive Functions  Concentration:Poor  Attention Span:Poor  Recall:Poor  Fund of Knowledge:Fair  Language:Fair   Psychomotor Activity  Psychomotor Activity:Psychomotor Activity:  Normal   Assets  Assets:No data recorded  Sleep  Sleep:Sleep: Poor Number of Hours of Sleep: 3   Nutritional Assessment (For OBS and FBC admissions only) Has the patient had a weight loss or gain of 10 pounds or more in the last 3 months?: No Has the patient had a decrease in food intake/or appetite?: No Does the patient have dental problems?: No Does the patient have eating habits or behaviors that may be indicators of an eating disorder including binging or inducing vomiting?: No Has the patient recently lost weight without trying?: 0 Has the patient been eating poorly because of a decreased appetite?: 0 Malnutrition Screening Tool Score: 0    Physical Exam Vitals and nursing note reviewed.  Constitutional:      General: She is not in acute distress.    Appearance: She is well-developed.  HENT:     Head: Normocephalic and atraumatic.  Eyes:     Conjunctiva/sclera: Conjunctivae normal.  Cardiovascular:     Rate and Rhythm: Normal rate.  Pulmonary:     Effort: Pulmonary effort is normal. No respiratory distress.  Abdominal:     Palpations: Abdomen is soft.     Tenderness: There is no abdominal tenderness.  Musculoskeletal:        General: No swelling. Normal range of motion.     Cervical back: Neck supple.  Skin:    General: Skin is dry.     Capillary Refill: Capillary refill takes less than 2 seconds.  Neurological:     Mental Status: She is alert and oriented to person, place, and time.  Psychiatric:        Attention and Perception: Attention and perception normal.        Mood and Affect: Mood is depressed.  Speech: Speech normal.        Behavior: Behavior normal.        Thought Content: Thought content normal. Thought content is not paranoid or delusional. Thought content does not include homicidal or suicidal ideation. Thought content does not include homicidal or suicidal plan.    Review of Systems  Constitutional: Negative.   HENT: Negative.     Eyes: Negative.   Respiratory: Negative.    Cardiovascular: Negative.   Gastrointestinal: Negative.   Genitourinary: Negative.   Musculoskeletal: Negative.   Skin: Negative.   Neurological: Negative.   Endo/Heme/Allergies: Negative.   Psychiatric/Behavioral:  Positive for depression and substance abuse. The patient is nervous/anxious.     Pulse 99, temperature 98 F (36.7 C), temperature source Oral, resp. rate 18, SpO2 100 %, unknown if currently breastfeeding. There is no height or weight on file to calculate BMI.  Past Psychiatric History:  polysubstance use disorder, HIV, hep C, IV drug use, borderline personality disorder  Is the patient at risk to self? Yes  Has the patient been a risk to self in the past 6 months? No .    Has the patient been a risk to self within the distant past? Yes   Is the patient a risk to others? No   Has the patient been a risk to others in the past 6 months? No   Has the patient been a risk to others within the distant past? No   Past Medical History:  Past Medical History:  Diagnosis Date   Anxiety    Back ache    DDD (degenerative disc disease)    H/O degenerative disc disease    Hepatitis    HIV (human immunodeficiency virus infection) (HCC)    Hypertension    Pancreatitis    Seizures (HCC)    Suicide attempt [facility])     Past Surgical History:  Procedure Laterality Date   BACK SURGERY      Family History:  Family History  Problem Relation Age of Onset   Hypertension Father    Hypertension Paternal Grandmother    Hypertension Paternal Grandfather     Social History:  Social History   Socioeconomic History   Marital status: Divorced    Spouse name: Not on file   Number of children: Not on file   Years of education: Not on file   Highest education level: Not on file  Occupational History   Not on file  Tobacco Use   Smoking status: Every Day    Packs/day: 0.50    Types: Cigarettes   Smokeless tobacco: Never  Vaping  Use   Vaping Use: Never used  Substance and Sexual Activity   Alcohol use: Yes    Comment: occasional   Drug use: No    Types: Oxycodone    Comment: hx of opiate, herion, cocaine use   Sexual activity: Yes    Birth control/protection: None  Other Topics Concern   Not on file  Social History Narrative   Not on file   Social Determinants of Health   Financial Resource Strain: Not on file  Food Insecurity: Not on file  Transportation Needs: Not on file  Physical Activity: Not on file  Stress: Not on file  Social Connections: Not on file  Intimate Partner Violence: Not on file    SDOH:  SDOH Screenings   Alcohol Screen: Not on file  Depression (PHQ2-9): Not on file  Financial Resource Strain: Not on file  Food Insecurity: Not  on file  Housing: Not on file  Physical Activity: Not on file  Social Connections: Not on file  Stress: Not on file  Tobacco Use: High Risk (12/12/2021)   Patient History    Smoking Tobacco Use: Every Day    Smokeless Tobacco Use: Never    Passive Exposure: Not on file  Transportation Needs: Not on file    Last Labs:  Admission on 12/12/2021, Discharged on 12/12/2021  Component Date Value Ref Range Status   WBC 12/12/2021 8.0  4.0 - 10.5 K/uL Final   RBC 12/12/2021 3.71 (L)  3.87 - 5.11 MIL/uL Final   Hemoglobin 12/12/2021 10.8 (L)  12.0 - 15.0 g/dL Final   HCT 05/27/3233 32.8 (L)  36.0 - 46.0 % Final   MCV 12/12/2021 88.4  80.0 - 100.0 fL Final   MCH 12/12/2021 29.1  26.0 - 34.0 pg Final   MCHC 12/12/2021 32.9  30.0 - 36.0 g/dL Final   RDW 57/32/2025 15.4  11.5 - 15.5 % Final   Platelets 12/12/2021 274  150 - 400 K/uL Final   nRBC 12/12/2021 0.0  0.0 - 0.2 % Final   Neutrophils Relative % 12/12/2021 71  % Final   Neutro Abs 12/12/2021 5.7  1.7 - 7.7 K/uL Final   Lymphocytes Relative 12/12/2021 20  % Final   Lymphs Abs 12/12/2021 1.6  0.7 - 4.0 K/uL Final   Monocytes Relative 12/12/2021 7  % Final   Monocytes Absolute 12/12/2021 0.5  0.1  - 1.0 K/uL Final   Eosinophils Relative 12/12/2021 1  % Final   Eosinophils Absolute 12/12/2021 0.1  0.0 - 0.5 K/uL Final   Basophils Relative 12/12/2021 1  % Final   Basophils Absolute 12/12/2021 0.0  0.0 - 0.1 K/uL Final   Immature Granulocytes 12/12/2021 0  % Final   Abs Immature Granulocytes 12/12/2021 0.03  0.00 - 0.07 K/uL Final   Performed at [facility], [address], [place], [place] [address]   Sodium 12/12/2021 140  135 - 145 mmol/L Final   Potassium 12/12/2021 3.6  3.5 - 5.1 mmol/L Final   Chloride 12/12/2021 107  98 - 111 mmol/L Final   CO2 12/12/2021 22  22 - 32 mmol/L Final   Glucose, Bld 12/12/2021 103 (H)  70 - 99 mg/dL Final   Glucose reference range applies only to samples taken after fasting for at least 8 hours.   BUN 12/12/2021 25 (H)  6 - 20 mg/dL Final   Creatinine, Ser 12/12/2021 0.67  0.44 - 1.00 mg/dL Final   Calcium 23/76/2831 9.2  8.9 - 10.3 mg/dL Final   Total Protein 51/76/1607 7.8  6.5 - 8.1 g/dL Final   Albumin 37/02/6268 4.0  3.5 - 5.0 g/dL Final   AST 48/54/6270 29  15 - 41 U/L Final   ALT 12/12/2021 17  0 - 44 U/L Final   Alkaline Phosphatase 12/12/2021 89  38 - 126 U/L Final   Total Bilirubin 12/12/2021 1.0  0.3 - 1.2 mg/dL Final   GFR, Estimated 12/12/2021 >60  >60 mL/min Final   Comment: (NOTE) Calculated using the CKD-EPI Creatinine Equation (2021)    Anion gap 12/12/2021 11  5 - 15 Final   Performed at [facility], [address], [place], [place] [address]   Opiates 12/12/2021 NONE DETECTED  NONE DETECTED Final   Cocaine 12/12/2021 NONE DETECTED  NONE DETECTED Final   Benzodiazepines 12/12/2021 NONE DETECTED  NONE DETECTED Final   Amphetamines 12/12/2021 POSITIVE (  A)  NONE DETECTED Final   Tetrahydrocannabinol 12/12/2021 NONE DETECTED  NONE DETECTED Final   Barbiturates 12/12/2021 NONE DETECTED  NONE DETECTED Final   Comment: (NOTE) DRUG SCREEN FOR MEDICAL PURPOSES ONLY.  IF CONFIRMATION IS  NEEDED FOR ANY PURPOSE, NOTIFY LAB WITHIN 5 DAYS.  LOWEST DETECTABLE LIMITS FOR URINE DRUG SCREEN Drug Class                     Cutoff (ng/mL) Amphetamine and metabolites    1000 Barbiturate and metabolites    200 Benzodiazepine                 200 Tricyclics and metabolites     300 Opiates and metabolites        300 Cocaine and metabolites        300 THC                            50 Performed at [facility], [address], [place], [place] [address]    Alcohol, Ethyl (B) 12/12/2021 <10  <10 mg/dL Final   Comment: (NOTE) Lowest detectable limit for serum alcohol is 10 mg/dL.  For medical purposes only. Performed at [facility], [address] [place], [place] [address]    I-stat hCG, quantitative 12/12/2021 <5.0  <5 mIU/mL Final   Comment 3 12/12/2021          Final   Comment:   GEST. AGE      CONC.  (mIU/mL)   <=1 WEEK        5 - 50     2 WEEKS       50 - 500     3 WEEKS       100 - 10,000     4 WEEKS     1,000 - 30,000        FEMALE AND NON-PREGNANT FEMALE:     LESS THAN 5 mIU/mL    Color, Urine 12/12/2021 AMBER (A)  YELLOW Final   BIOCHEMICALS MAY BE AFFECTED BY COLOR   APPearance 12/12/2021 HAZY (A)  CLEAR Final   Specific Gravity, Urine 12/12/2021 1.029  1.005 - 1.030 Final   pH 12/12/2021 5.0  5.0 - 8.0 Final   Glucose, UA 12/12/2021 NEGATIVE  NEGATIVE mg/dL Final   Hgb urine dipstick 12/12/2021 NEGATIVE  NEGATIVE Final   Bilirubin Urine 12/12/2021 NEGATIVE  NEGATIVE Final   Ketones, ur 12/12/2021 80 (A)  NEGATIVE mg/dL Final   Protein, ur 25/36/6440 30 (A)  NEGATIVE mg/dL Final   Nitrite 34/74/2595 NEGATIVE  NEGATIVE Final   Leukocytes,Ua 12/12/2021 NEGATIVE  NEGATIVE Final   RBC / HPF 12/12/2021 0-5  0 - 5 RBC/hpf Final   WBC, UA 12/12/2021 0-5  0 - 5 WBC/hpf Final   Bacteria, UA 12/12/2021 NONE SEEN  NONE SEEN Final   Squamous Epithelial / LPF 12/12/2021 0-5  0 - 5 Final   Mucus 12/12/2021 PRESENT   Final    Hyaline Casts, UA 12/12/2021 PRESENT   Final   Performed at [facility], [address], [place], [place] [address]   SARS Coronavirus 2 by RT PCR 12/12/2021 NEGATIVE  NEGATIVE Final   Comment: (NOTE) SARS-CoV-2 target nucleic acids are NOT DETECTED.  The SARS-CoV-2 RNA is generally detectable in upper respiratory specimens during the acute phase of infection. The lowest concentration of SARS-CoV-2 viral copies this assay can detect is 138  copies/mL. A negative result does not preclude SARS-Cov-2 infection and should not be used as the sole basis for treatment or other patient management decisions. A negative result may occur with  improper specimen collection/handling, submission of specimen other than nasopharyngeal swab, presence of viral mutation(s) within the areas targeted by this assay, and inadequate number of viral copies(<138 copies/mL). A negative result must be combined with clinical observations, patient history, and epidemiological information. The expected result is Negative.  Fact Sheet for Patients:  BloggerCourse.com  Fact Sheet for Healthcare Providers:  SeriousBroker.it  This test is no                          t yet approved or cleared by the [place] FDA and  has been authorized for detection and/or diagnosis of SARS-CoV-2 by FDA under an Emergency Use Authorization (EUA). This EUA will remain  in effect (meaning this test can be used) for the duration of the COVID-19 declaration under Section 564(b)(1) of the Act, 21 U.S.C.section 360bbb-3(b)(1), unless the authorization is terminated  or revoked sooner.       Influenza A by PCR 12/12/2021 NEGATIVE  NEGATIVE Final   Influenza B by PCR 12/12/2021 NEGATIVE  NEGATIVE Final   Comment: (NOTE) The Xpert Xpress SARS-CoV-2/FLU/RSV plus assay is intended as an aid in the diagnosis of influenza from Nasopharyngeal swab specimens and should  not be used as a sole basis for treatment. Nasal washings and aspirates are unacceptable for Xpert Xpress SARS-CoV-2/FLU/RSV testing.  Fact Sheet for Patients: BloggerCourse.com  Fact Sheet for Healthcare Providers: SeriousBroker.it  This test is not yet approved or cleared by the [place] FDA and has been authorized for detection and/or diagnosis of SARS-CoV-2 by FDA under an Emergency Use Authorization (EUA). This EUA will remain in effect (meaning this test can be used) for the duration of the COVID-19 declaration under Section 564(b)(1) of the Act, 21 U.S.C. section 360bbb-3(b)(1), unless the authorization is terminated or revoked.  Performed at [facility], [address] [place], [place] [address]     Allergies: Sulfa antibiotics  PTA Medications: (Not in a hospital admission)   Medical Decision Making  Patient will be admitted to [facility] for continuous assessment with follow-up by psychiatric on 12/13/21.    Recommendations  Based on my evaluation the patient does not appear to have an emergency medical condition.  Maricela Bo, NP 12/12/21  11:03 PM

## 2021-12-12 NOTE — ED Notes (Signed)
The patient has been accepted to [facility]. This RN has called over there to arrange acceptance but there was no answer. Will try again.

## 2021-12-12 NOTE — ED Notes (Signed)
Pt ambulatory with independent steady gait, accompanied by two sherriff's officers. Belongings given to officers.

## 2021-12-12 NOTE — ED Notes (Signed)
This RN has called the sheriff's office to arrange transportation to [facility].

## 2021-12-12 NOTE — ED Notes (Signed)
IV attempt x 3 without success.  While attempting to start IV, pt begins to yell loudly stating that "she's hurting me."

## 2021-12-12 NOTE — ED Notes (Signed)
Pt changed into burgundy scrubs. Belongings placed in belongings bag

## 2021-12-12 NOTE — ED Provider Notes (Signed)
Patient accepted to [facility]. Accepting is Doran Heater. She agrees to this transfer and is stable.   Pricilla Loveless, MD 12/12/21 1945

## 2021-12-13 DIAGNOSIS — F1510 Other stimulant abuse, uncomplicated: Secondary | ICD-10-CM | POA: Diagnosis present

## 2021-12-13 NOTE — ED Notes (Signed)
Pt presents with bizarre behaviors, stare inappropriately at others, suspicious of others. Denies SI/HI/AVH. Able to be redirected. Pt was wearing a nicotine patch from another pt. Patch was removed and report was given to transferring nurse of pt request for a patch. Pt escorted off unit to [facility] unit without difficulty. Safety maintained.

## 2021-12-13 NOTE — ED Notes (Addendum)
Patient admitted to [facility] due to poly substance use disorder. Pt denies SI,HI,AVH. Patient was cooperative during the admission assessment. Skin assessment complete. Belongings inventoried. Patient oriented to unit and unit rules. Meal and drinks offered to patient pt refused.  Pt stated she is tired.Patient verbalized agreement to treatment plans.  Will monitor for safety.

## 2021-12-13 NOTE — Discharge Instructions (Addendum)
Discharge recommendations:  Patient is to take medications as prescribed. Please see information for follow-up appointment with psychiatry and therapy. Please follow up with your primary care provider for all medical related needs.   Therapy: We recommend that patient participate in individual therapy to address mental health concerns.  Medications: The patient is to contact a medical professional and/or outpatient provider to address any new side effects that develop. The patient should update outpatient providers of any new medications and/or medication changes.   Safety:  The patient should abstain from use of illicit substances/drugs and abuse of any medications. If symptoms worsen or do not continue to improve or if the patient becomes actively suicidal or homicidal then it is recommended that the patient return to the closest hospital emergency department, the [facility], or call 911 for further evaluation and treatment. National Suicide Prevention Lifeline 1-800-SUICIDE or 1-800-273-8255.  About 988 988 offers 24/7 access to trained crisis counselors who can help people experiencing mental health-related distress. People can call or text 988 or chat 988lifeline.org for themselves or if they are worried about a loved one who may need crisis support.      

## 2021-12-13 NOTE — ED Notes (Signed)
Pt was given breakfast  ?

## 2021-12-13 NOTE — ED Notes (Signed)
Patient was discharged to home. She received all of her belongings from her locker, document signed. She went home with a family member. She was able to ambulate out of the facility.

## 2021-12-13 NOTE — ED Notes (Signed)
Pt was escorted to the [facility] and was d/c from facility.

## 2021-12-13 NOTE — ED Notes (Signed)
Pt is in the bed sleeping. Respirations are even and unlabored. No acute distress noted. Will continue to monitor for safety. 

## 2021-12-13 NOTE — ED Provider Notes (Addendum)
FBC/OBS ASAP Discharge Summary  Date and Time: 12/13/2021 11:11 AM  Name: Alicia Hayes  MRN:  [id number]   Discharge Diagnoses:  Final diagnoses:  Methamphetamine abuse [facility])  Disorientation    Subjective: Patient seen and evaluated face to face by this provider and chart reviewed. Today, patient is oriented to person, and time. She is disoriented to place (states she's in [place]) and situation. She is unable to recall why she was taken to the emergency department for an evaluation. She is unable to recall events that led up to her presenting to the ED. She appears to have thought blocking and delayed speech. She appears disheveled. Her mood is anxious and affect is congruent. She is noted to be fidgety throughout the assessment. She denies SI/HI/AVH. There is no objective evidence that the patient is responding to internal or external stimuli. She reports fair sleep, 5 hours per night. She reports a fair appetite. She denies depressive symptoms. She initially denies drinking alcohol or using illicit drugs. However, she was informed that her urine drug screen shows positive for amphetamines. She then states that she uses methamphetamines "every now and then." She states that she uses a "little bit." She reports using meth for the past year or two. She states that she last used meth a "couple days ago." She reports a sobriety of two years from using opiates. She states that she follow up at [facility] for medication management. She states that she is prescribed suboxone. Per PDMP, patient filled Suboxone 8 mg -2, QTY 42 x 14 days and Gabapentin 600 mg, QTY #42 x 14 days on 12/06/21. Patient reports only taking Suboxone. She denies outpatient psychiatric services, including therapy. She denies a past psychiatric history, including inpatient treatment or substance abuse treatment. I discussed with the patient her plans for discharge. She states that she will return to her mother's home and that  her fianc "Alicia Hayes" can pick her up. She states that she is not interested in substance abuse treatment.  Patient states that she resides with her mother "Alicia Hayes" and fianc "Alicia Hayes" in [place].   Stay Summary: Alicia Hayes is a 46 year old female patient who initially presented to the [facility] after she was found unresponsive by EMS on the side of the road on 12/12/21 with altered mental status.  She was transferred to the [facility] for continuous assessment for overnight observation. She was observed on the unit without any aggressive, disruptive, self-harm, or psychotic behaviors. Patient was offered admission to the facility based crisis unit for substance abuse treatment. However, patient initially agreed to admission to the [facility] but later requested to be discharged. Patient does not meet [facility] IVC criteria. Patient does not appear to be a danger to self or others. There is no evidence of psychosis. Patient appears to be at base-line per chart review. Patient discharged with outpatient resources for psychiatry and substance abuse treatment.   Patient gives verbal consent for this provider to obtain collateral information from her mother Alicia Hayes [phone]. [name] states that her last name is not Riding, it is Nolen Hayes and that the patient knows that. She states that the patient does not reside with her and hasn't lived with her for a long time. She states that she is glad the patient is here at the facility because the patient needs help. She states that the patient has been confused, hearing voices, acting irate, hard to get along with and using drugs for the past  six months. She states that the patient has been in and out of the hospital and was last at the hospital in [place]. She states that the police and hospital staff keeps calling because the patient is roaming and wandering around the streets. She states that the patient was supposed to hear back from  [facility] for substance abuse treatment, but no one called back. Alicia Hayes states she is currently taking care of the patient's 82 year old daughter and the patient is not allowed to return home because she brings drugs into the house and becomes hateful. She states that the patient needs to get treatment and that the patient has nowhere to go. She states that she believes the patient was kicked out of her boyfriend's house and that he does not have a car to pick the patient up.  Alicia Hayes was informed that the patient requested to be discharged. Alicia Hayes states that the patient keeps refusing treatment and that she will not get well until the patient is willing to get treatment.   Total Time spent with patient: 30 minutes  Past Psychiatric History: Patient has as past psychiatric history significant for hallucinations, opioid use disorder, methamphetamine use disorder and altered mental status. Past Medical History:   Past Medical History:  Diagnosis Date   Anxiety    Back ache    DDD (degenerative disc disease)    H/O degenerative disc disease    Hepatitis    HIV (human immunodeficiency virus infection) (HCC)    Hypertension    Pancreatitis    Seizures (HCC)    Suicide attempt [facility])     Past Surgical History:  Procedure Laterality Date   BACK SURGERY     Family History:  Family History  Problem Relation Age of Onset   Hypertension Father    Hypertension Paternal Grandmother    Hypertension Paternal Grandfather    Family Psychiatric History: No history reported. Social History:  Social History   Substance and Sexual Activity  Alcohol Use Yes   Comment: occasional     Social History   Substance and Sexual Activity  Drug Use No   Types: Oxycodone   Comment: hx of opiate, herion, cocaine use    Social History   Socioeconomic History   Marital status: Divorced    Spouse name: Not on file   Number of children: Not on file   Years of education: Not on file    Highest education level: Not on file  Occupational History   Not on file  Tobacco Use   Smoking status: Every Day    Packs/day: 0.50    Types: Cigarettes   Smokeless tobacco: Never  Vaping Use   Vaping Use: Never used  Substance and Sexual Activity   Alcohol use: Yes    Comment: occasional   Drug use: No    Types: Oxycodone    Comment: hx of opiate, herion, cocaine use   Sexual activity: Yes    Birth control/protection: None  Other Topics Concern   Not on file  Social History Narrative   Not on file   Social Determinants of Health   Financial Resource Strain: Not on file  Food Insecurity: Not on file  Transportation Needs: Not on file  Physical Activity: Not on file  Stress: Not on file  Social Connections: Not on file   SDOH:  SDOH Screenings   Alcohol Screen: Not on file  Depression (WJX9-1): Not on file  Financial Resource Strain: Not on file  Food Insecurity: Not on file  Housing: Not on file  Physical Activity: Not on file  Social Connections: Not on file  Stress: Not on file  Tobacco Use: High Risk (12/12/2021)   Patient History    Smoking Tobacco Use: Every Day    Smokeless Tobacco Use: Never    Passive Exposure: Not on file  Transportation Needs: Not on file    Tobacco Cessation:  N/A, patient does not currently use tobacco products  Current Medications:  Current Facility-Administered Medications  Medication Dose Route Frequency Provider Last Rate Last Admin   acetaminophen (TYLENOL) tablet 650 mg  650 mg Oral Q6H PRN Alicia Hayes, Ene A, NP       alum & mag hydroxide-simeth (MAALOX/MYLANTA) 200-200-20 MG/5ML suspension 30 mL  30 mL Oral Q4H PRN Alicia Hayes, Ene A, NP       hydrOXYzine (ATARAX) tablet 25 mg  25 mg Oral TID PRN Alicia Hayes, Ene A, NP       magnesium hydroxide (MILK OF MAGNESIA) suspension 30 mL  30 mL Oral Daily PRN Alicia Hayes, Ene A, NP       traZODone (DESYREL) tablet 50 mg  50 mg Oral QHS PRN Alicia Hayes, Ene A, NP       Current Outpatient  Medications  Medication Sig Dispense Refill   Buprenorphine HCl-Naloxone HCl 8-2 MG FILM Place 1 Film under the tongue 3 (three) times daily. Take for 14 days starting on 12/06/21.     naloxone [facility]) nasal spray 4 mg/0.1 mL Place 1 spray into the nose once as needed (For opioid overdose).      PTA Medications: (Not in a hospital admission)      12/13/2021   10:46 AM  Depression screen PHQ 2/9  Decreased Interest 0  Down, Depressed, Hopeless 0  PHQ - 2 Score 0    Musculoskeletal  Strength & Muscle Tone: within normal limits Gait & Station: normal Patient leans: N/A  Psychiatric Specialty Exam  Presentation  General Appearance: Disheveled  Eye Contact:Minimal  Speech:Slow  Speech Volume:Decreased  Handedness:Right   Mood and Affect  Mood:Anxious  Affect:Congruent   Thought Process  Thought Processes:Disorganized  Descriptions of Associations:Intact  Orientation:Partial  Thought Content:Logical     Hallucinations:Hallucinations: None  Ideas of Reference:None  Suicidal Thoughts:Suicidal Thoughts: No  Homicidal Thoughts:Homicidal Thoughts: No   Sensorium  Memory:Immediate Poor  Judgment:Intact  Insight:Shallow   Executive Functions  Concentration:Poor  Attention Span:Poor  Recall:Poor  Fund of Knowledge:Poor  Language:Fair   Psychomotor Activity  Psychomotor Activity:Psychomotor Activity: Restlessness   Assets  Assets:Physical Health; Leisure Time; Desire for Improvement   Sleep  Sleep:Sleep: Fair Number of Hours of Sleep: 5   Nutritional Assessment (For OBS and FBC admissions only) Has the patient had a weight loss or gain of 10 pounds or more in the last 3 months?: No Has the patient had a decrease in food intake/or appetite?: No Does the patient have dental problems?: No Does the patient have eating habits or behaviors that may be indicators of an eating disorder including binging or inducing vomiting?: No Has the  patient recently lost weight without trying?: 0 Has the patient been eating poorly because of a decreased appetite?: 0 Malnutrition Screening Tool Score: 0    Physical Exam  Physical Exam HENT:     Head: Normocephalic.     Nose: Nose normal.  Eyes:     Conjunctiva/sclera: Conjunctivae normal.  Cardiovascular:     Rate and Rhythm: Normal rate.  Pulmonary:     Effort:  Pulmonary effort is normal.  Musculoskeletal:     Cervical back: Normal range of motion.  Neurological:     Mental Status: She is alert and oriented to person, place, and time.    Review of Systems  Constitutional: Negative.   HENT: Negative.    Eyes: Negative.   Respiratory: Negative.    Cardiovascular: Negative.   Gastrointestinal: Negative.   Genitourinary: Negative.   Musculoskeletal: Negative.   Skin: Negative.   Neurological: Negative.   Endo/Heme/Allergies: Negative.    Blood pressure 116/78, pulse (!) 105, temperature 98.3 F (36.8 C), temperature source Temporal, resp. rate 19, SpO2 100 %, unknown if currently breastfeeding. There is no height or weight on file to calculate BMI.  Demographic Factors:  Caucasian, Low socioeconomic status, Living alone, and Unemployed  Loss Factors: Financial problems/change in socioeconomic status  Historical Factors: Impulsivity  Risk Reduction Factors:   Responsible for children under 22 years of age and Positive social support  Continued Clinical Symptoms:  Alcohol/Substance Abuse/Dependencies  Cognitive Features That Contribute To Risk:  None    Suicide Risk:  Minimal: No identifiable suicidal ideation.  Patients presenting with no risk factors but with morbid ruminations; may be classified as minimal risk based on the severity of the depressive symptoms  Plan Of Care/Follow-up recommendations:  Activity:  as tolerated   Follow-up Information     [facility] Follow up.   Why: substance abuse treatment and transitional housing if  needed Contact information: [address] [place], [place] [address] [phone]        [facility] Follow up.   Why: substance abuse treatment and outpatient therapy Contact information: [address] [place], [place] [address] [phone]        [facility] Follow up.   Why: medication management, substance abuse counseling, and group therapy.  they also have a location in [place]. Contact information: [address] [facility] [address] [place], [place] [address] [phone]        [facility] Follow up.   Specialty: Professional Counselor Why: outpatient therapy, substance abuse treatment, and medication management.  They also have an office in [facility]. Contact information: [facility] [address] [place] [address] [phone]                 Disposition: Patient discharged to self/home. Per Nicolette Bang, RN., patient picked up by her boyfriend.   Doralyn Kirkes L, NP 12/13/2021, 11:11 AM

## 2021-12-13 NOTE — ED Provider Notes (Deleted)
Facility Based Crisis Admission H&P  Date: 12/13/21 Patient Name: Alicia Hayes MRN: [id number]  Diagnoses:  Final diagnoses:  Methamphetamine abuse [facility])  Disorientation

## 2021-12-13 NOTE — ED Notes (Signed)
Pt was given Breakfast
